# Patient Record
Sex: Female | Born: 1955 | Race: Black or African American | Hispanic: No | Smoking: Former smoker
Health system: Southern US, Community
[De-identification: ages and names within clinical notes are randomized; demographics above are authoritative.]

## PROBLEM LIST (undated history)

## (undated) DIAGNOSIS — E669 Obesity, unspecified: Secondary | ICD-10-CM

## (undated) DIAGNOSIS — M779 Enthesopathy, unspecified: Secondary | ICD-10-CM

## (undated) DIAGNOSIS — T7840XA Allergy, unspecified, initial encounter: Secondary | ICD-10-CM

## (undated) DIAGNOSIS — D219 Benign neoplasm of connective and other soft tissue, unspecified: Secondary | ICD-10-CM

## (undated) DIAGNOSIS — J189 Pneumonia, unspecified organism: Secondary | ICD-10-CM

## (undated) DIAGNOSIS — R51 Headache: Secondary | ICD-10-CM

## (undated) DIAGNOSIS — R519 Headache, unspecified: Secondary | ICD-10-CM

## (undated) DIAGNOSIS — D249 Benign neoplasm of unspecified breast: Secondary | ICD-10-CM

## (undated) DIAGNOSIS — M7730 Calcaneal spur, unspecified foot: Secondary | ICD-10-CM

## (undated) DIAGNOSIS — K635 Polyp of colon: Secondary | ICD-10-CM

## (undated) DIAGNOSIS — I10 Essential (primary) hypertension: Secondary | ICD-10-CM

## (undated) HISTORY — DX: Essential (primary) hypertension: I10

## (undated) HISTORY — DX: Enthesopathy, unspecified: M77.9

## (undated) HISTORY — DX: Polyp of colon: K63.5

## (undated) HISTORY — DX: Calcaneal spur, unspecified foot: M77.30

## (undated) HISTORY — DX: Benign neoplasm of unspecified breast: D24.9

## (undated) HISTORY — PX: VAGINAL HYSTERECTOMY: SUR661

## (undated) HISTORY — DX: Pneumonia, unspecified organism: J18.9

## (undated) HISTORY — DX: Obesity, unspecified: E66.9

## (undated) HISTORY — DX: Benign neoplasm of connective and other soft tissue, unspecified: D21.9

## (undated) HISTORY — DX: Allergy, unspecified, initial encounter: T78.40XA

---

## 1985-03-28 DIAGNOSIS — M779 Enthesopathy, unspecified: Secondary | ICD-10-CM

## 1985-03-28 HISTORY — DX: Enthesopathy, unspecified: M77.9

## 1988-03-28 DIAGNOSIS — J189 Pneumonia, unspecified organism: Secondary | ICD-10-CM

## 1988-03-28 HISTORY — DX: Pneumonia, unspecified organism: J18.9

## 2000-01-10 ENCOUNTER — Emergency Department (HOSPITAL_COMMUNITY): Admission: EM | Admit: 2000-01-10 | Discharge: 2000-01-10 | Payer: Self-pay | Admitting: Emergency Medicine

## 2000-06-26 ENCOUNTER — Other Ambulatory Visit: Admission: RE | Admit: 2000-06-26 | Discharge: 2000-06-26 | Payer: Self-pay | Admitting: Obstetrics and Gynecology

## 2001-06-29 ENCOUNTER — Other Ambulatory Visit: Admission: RE | Admit: 2001-06-29 | Discharge: 2001-06-29 | Payer: Self-pay | Admitting: Obstetrics and Gynecology

## 2002-09-03 ENCOUNTER — Other Ambulatory Visit: Admission: RE | Admit: 2002-09-03 | Discharge: 2002-09-03 | Payer: Self-pay | Admitting: Obstetrics and Gynecology

## 2002-09-10 ENCOUNTER — Encounter (INDEPENDENT_AMBULATORY_CARE_PROVIDER_SITE_OTHER): Payer: Self-pay | Admitting: *Deleted

## 2002-09-10 ENCOUNTER — Observation Stay (HOSPITAL_COMMUNITY): Admission: RE | Admit: 2002-09-10 | Discharge: 2002-09-11 | Payer: Self-pay | Admitting: Obstetrics and Gynecology

## 2003-11-04 ENCOUNTER — Other Ambulatory Visit: Admission: RE | Admit: 2003-11-04 | Discharge: 2003-11-04 | Payer: Self-pay | Admitting: Obstetrics and Gynecology

## 2004-05-03 ENCOUNTER — Ambulatory Visit: Payer: Self-pay | Admitting: Family Medicine

## 2004-06-14 ENCOUNTER — Encounter: Payer: Self-pay | Admitting: Family Medicine

## 2004-06-25 ENCOUNTER — Encounter: Admission: RE | Admit: 2004-06-25 | Discharge: 2004-06-25 | Payer: Self-pay | Admitting: Neurology

## 2004-07-14 ENCOUNTER — Ambulatory Visit: Payer: Self-pay | Admitting: Family Medicine

## 2004-08-10 ENCOUNTER — Ambulatory Visit: Payer: Self-pay | Admitting: Family Medicine

## 2005-04-01 ENCOUNTER — Ambulatory Visit: Payer: Self-pay | Admitting: Pulmonary Disease

## 2005-05-09 ENCOUNTER — Ambulatory Visit: Payer: Self-pay | Admitting: Pulmonary Disease

## 2005-07-04 ENCOUNTER — Other Ambulatory Visit: Admission: RE | Admit: 2005-07-04 | Discharge: 2005-07-04 | Payer: Self-pay | Admitting: Obstetrics and Gynecology

## 2005-07-20 ENCOUNTER — Ambulatory Visit: Payer: Self-pay | Admitting: Family Medicine

## 2005-07-27 ENCOUNTER — Ambulatory Visit: Payer: Self-pay | Admitting: Family Medicine

## 2006-09-21 ENCOUNTER — Encounter: Payer: Self-pay | Admitting: Family Medicine

## 2006-09-21 DIAGNOSIS — J45909 Unspecified asthma, uncomplicated: Secondary | ICD-10-CM | POA: Insufficient documentation

## 2006-09-22 ENCOUNTER — Ambulatory Visit: Payer: Self-pay | Admitting: Family Medicine

## 2006-09-22 LAB — CONVERTED CEMR LAB
ALT: 23 units/L (ref 0–35)
AST: 18 units/L (ref 0–37)
Albumin: 4 g/dL (ref 3.5–5.2)
Alkaline Phosphatase: 41 units/L (ref 39–117)
BUN: 8 mg/dL (ref 6–23)
Basophils Absolute: 0 10*3/uL (ref 0.0–0.1)
Basophils Relative: 0.9 % (ref 0.0–1.0)
Bilirubin, Direct: 0.1 mg/dL (ref 0.0–0.3)
CO2: 32 meq/L (ref 19–32)
Calcium: 9.2 mg/dL (ref 8.4–10.5)
Chloride: 109 meq/L (ref 96–112)
Cholesterol: 197 mg/dL (ref 0–200)
Creatinine, Ser: 0.7 mg/dL (ref 0.4–1.2)
Eosinophils Absolute: 0 10*3/uL (ref 0.0–0.6)
Eosinophils Relative: 0.6 % (ref 0.0–5.0)
GFR calc Af Amer: 114 mL/min
GFR calc non Af Amer: 95 mL/min
Glucose, Bld: 88 mg/dL (ref 70–99)
HCT: 37.9 % (ref 36.0–46.0)
HDL: 34.8 mg/dL — ABNORMAL LOW (ref >39.0)
Hemoglobin: 12.7 g/dL (ref 12.0–15.0)
LDL Cholesterol: 150 mg/dL — ABNORMAL HIGH (ref 0–99)
Lymphocytes Relative: 43 % (ref 12.0–46.0)
MCHC: 33.4 g/dL (ref 30.0–36.0)
MCV: 87.3 fL (ref 78.0–100.0)
Monocytes Absolute: 0.4 10*3/uL (ref 0.2–0.7)
Monocytes Relative: 10.2 % (ref 3.0–11.0)
Neutro Abs: 1.9 10*3/uL (ref 1.4–7.7)
Neutrophils Relative %: 45.3 % (ref 43.0–77.0)
Platelets: 277 10*3/uL (ref 150–400)
Potassium: 3.6 meq/L (ref 3.5–5.1)
RBC: 4.34 M/uL (ref 3.87–5.11)
RDW: 11.7 % (ref 11.5–14.6)
Sodium: 145 meq/L (ref 135–145)
TSH: 0.17 microintl units/mL — ABNORMAL LOW (ref 0.35–5.50)
Total Bilirubin: 0.7 mg/dL (ref 0.3–1.2)
Total CHOL/HDL Ratio: 5.7
Total Protein: 7.6 g/dL (ref 6.0–8.3)
Triglycerides: 63 mg/dL (ref 0–149)
VLDL: 13 mg/dL (ref 0–40)
WBC: 4.1 10*3/uL — ABNORMAL LOW (ref 4.5–10.5)

## 2006-11-09 ENCOUNTER — Ambulatory Visit: Payer: Self-pay | Admitting: Family Medicine

## 2006-11-09 DIAGNOSIS — F17210 Nicotine dependence, cigarettes, uncomplicated: Secondary | ICD-10-CM | POA: Insufficient documentation

## 2006-11-09 DIAGNOSIS — I119 Hypertensive heart disease without heart failure: Secondary | ICD-10-CM | POA: Insufficient documentation

## 2006-12-14 ENCOUNTER — Ambulatory Visit: Payer: Self-pay | Admitting: Family Medicine

## 2006-12-14 DIAGNOSIS — L2089 Other atopic dermatitis: Secondary | ICD-10-CM | POA: Insufficient documentation

## 2006-12-14 DIAGNOSIS — H6120 Impacted cerumen, unspecified ear: Secondary | ICD-10-CM | POA: Insufficient documentation

## 2007-09-03 ENCOUNTER — Telehealth: Payer: Self-pay | Admitting: Family Medicine

## 2008-06-30 ENCOUNTER — Ambulatory Visit: Payer: Self-pay | Admitting: Family Medicine

## 2008-06-30 LAB — CONVERTED CEMR LAB
ALT: 20 units/L (ref 0–35)
AST: 24 units/L (ref 0–37)
Albumin: 4 g/dL (ref 3.5–5.2)
Alkaline Phosphatase: 39 units/L (ref 39–117)
BUN: 13 mg/dL (ref 6–23)
Basophils Absolute: 0 10*3/uL (ref 0.0–0.1)
Basophils Relative: 0.7 % (ref 0.0–3.0)
Bilirubin Urine: NEGATIVE
Bilirubin, Direct: 0.1 mg/dL (ref 0.0–0.3)
Blood in Urine, dipstick: NEGATIVE
CO2: 29 meq/L (ref 19–32)
Calcium: 9.3 mg/dL (ref 8.4–10.5)
Chloride: 101 meq/L (ref 96–112)
Cholesterol: 184 mg/dL (ref 0–200)
Creatinine, Ser: 0.8 mg/dL (ref 0.4–1.2)
Direct LDL: 113.1 mg/dL
Eosinophils Absolute: 0.1 10*3/uL (ref 0.0–0.7)
Eosinophils Relative: 2.3 % (ref 0.0–5.0)
GFR calc non Af Amer: 97.14 mL/min (ref >60)
Glucose, Bld: 106 mg/dL — ABNORMAL HIGH (ref 70–99)
Glucose, Urine, Semiquant: NEGATIVE
HCT: 37.3 % (ref 36.0–46.0)
HDL: 30.9 mg/dL — ABNORMAL LOW (ref >39.00)
Hemoglobin: 12.6 g/dL (ref 12.0–15.0)
Ketones, urine, test strip: NEGATIVE
Lymphocytes Relative: 54.3 % — ABNORMAL HIGH (ref 12.0–46.0)
Lymphs Abs: 2.5 10*3/uL (ref 0.7–4.0)
MCHC: 33.9 g/dL (ref 30.0–36.0)
MCV: 87.5 fL (ref 78.0–100.0)
Monocytes Absolute: 0.5 10*3/uL (ref 0.1–1.0)
Monocytes Relative: 9.6 % (ref 3.0–12.0)
Neutro Abs: 1.6 10*3/uL (ref 1.4–7.7)
Neutrophils Relative %: 33.1 % — ABNORMAL LOW (ref 43.0–77.0)
Nitrite: NEGATIVE
Platelets: 218 10*3/uL (ref 150.0–400.0)
Potassium: 3.2 meq/L — ABNORMAL LOW (ref 3.5–5.1)
RBC: 4.26 M/uL (ref 3.87–5.11)
RDW: 11.9 % (ref 11.5–14.6)
Sodium: 140 meq/L (ref 135–145)
Specific Gravity, Urine: >=1.03
TSH: 0.28 microintl units/mL — ABNORMAL LOW (ref 0.35–5.50)
Total Bilirubin: 0.5 mg/dL (ref 0.3–1.2)
Total CHOL/HDL Ratio: 6
Total Protein: 7.5 g/dL (ref 6.0–8.3)
Triglycerides: 207 mg/dL — ABNORMAL HIGH (ref 0.0–149.0)
Urobilinogen, UA: 0.2
VLDL: 41.4 mg/dL — ABNORMAL HIGH (ref 0.0–40.0)
WBC Urine, dipstick: NEGATIVE
WBC: 4.7 10*3/uL (ref 4.5–10.5)
pH: 6

## 2008-07-02 ENCOUNTER — Encounter: Payer: Self-pay | Admitting: Family Medicine

## 2008-11-11 ENCOUNTER — Telehealth: Payer: Self-pay | Admitting: Adult Health

## 2008-11-11 ENCOUNTER — Ambulatory Visit: Payer: Self-pay | Admitting: Adult Health

## 2008-11-11 DIAGNOSIS — G4730 Sleep apnea, unspecified: Secondary | ICD-10-CM | POA: Insufficient documentation

## 2008-11-11 DIAGNOSIS — T7840XA Allergy, unspecified, initial encounter: Secondary | ICD-10-CM | POA: Insufficient documentation

## 2009-03-06 ENCOUNTER — Ambulatory Visit: Payer: Self-pay | Admitting: Family Medicine

## 2009-05-13 ENCOUNTER — Telehealth: Payer: Self-pay | Admitting: Pulmonary Disease

## 2009-05-29 ENCOUNTER — Emergency Department (HOSPITAL_COMMUNITY): Admission: EM | Admit: 2009-05-29 | Discharge: 2009-05-29 | Payer: Self-pay | Admitting: Emergency Medicine

## 2009-10-30 ENCOUNTER — Ambulatory Visit: Payer: Self-pay | Admitting: Family Medicine

## 2010-03-24 ENCOUNTER — Ambulatory Visit: Payer: Self-pay | Admitting: Family Medicine

## 2010-03-24 LAB — CONVERTED CEMR LAB
Albumin: 4.1 g/dL (ref 3.5–5.2)
BUN: 20 mg/dL (ref 6–23)
Basophils Absolute: 0 10*3/uL (ref 0.0–0.1)
Bilirubin Urine: NEGATIVE
CO2: 33 meq/L — ABNORMAL HIGH (ref 19–32)
Direct LDL: 164.1 mg/dL
Eosinophils Absolute: 0.1 10*3/uL (ref 0.0–0.7)
GFR calc non Af Amer: 91.21 mL/min (ref >60.00)
Glucose, Bld: 86 mg/dL (ref 70–99)
Glucose, Urine, Semiquant: NEGATIVE
HCT: 38.5 % (ref 36.0–46.0)
HDL: 45.9 mg/dL (ref >39.00)
Hemoglobin: 12.8 g/dL (ref 12.0–15.0)
Ketones, urine, test strip: NEGATIVE
Lymphs Abs: 3.3 10*3/uL (ref 0.7–4.0)
MCHC: 33.2 g/dL (ref 30.0–36.0)
MCV: 89.1 fL (ref 78.0–100.0)
Monocytes Absolute: 0.5 10*3/uL (ref 0.1–1.0)
Monocytes Relative: 8.1 % (ref 3.0–12.0)
Neutro Abs: 2.5 10*3/uL (ref 1.4–7.7)
Nitrite: NEGATIVE
Platelets: 281 10*3/uL (ref 150.0–400.0)
Potassium: 4.1 meq/L (ref 3.5–5.1)
Protein, U semiquant: NEGATIVE
RDW: 13.1 % (ref 11.5–14.6)
Specific Gravity, Urine: 1.025
TSH: 0.44 microintl units/mL (ref 0.35–5.50)
Total Bilirubin: 0.7 mg/dL (ref 0.3–1.2)
Urobilinogen, UA: 0.2
WBC Urine, dipstick: NEGATIVE
pH: 5.5

## 2010-04-17 ENCOUNTER — Encounter: Payer: Self-pay | Admitting: Neurology

## 2010-04-19 ENCOUNTER — Ambulatory Visit
Admission: RE | Admit: 2010-04-19 | Discharge: 2010-04-19 | Payer: Self-pay | Source: Home / Self Care | Attending: Family Medicine | Admitting: Family Medicine

## 2010-04-19 ENCOUNTER — Encounter: Payer: Self-pay | Admitting: Family Medicine

## 2010-04-19 DIAGNOSIS — R319 Hematuria, unspecified: Secondary | ICD-10-CM | POA: Insufficient documentation

## 2010-04-27 NOTE — Progress Notes (Signed)
Summary: old pt  Phone Note Call from Patient Call back at Work Phone [phone] Call back at page her   Caller: Patient Call For: nadel Reason for Call: Talk to Nurse Summary of Call: pt hasn't seen SN sin 04/2005.  Saw TP in 10/2008.  Wanted to schedule appt w/ SN.  Will he still see this pt? Initial call taken by: Eugene Gavia,  May 13, 2009 10:55 AM  Follow-up for Phone Call        per SN---she will need to follow up with her primary care doctor first and they will refer her to someone in [facility] practice if they feel she needs to see pulmonary doc.  SN schedule is booked out until may.  thanks Randell Loop [facility]  May 14, 2009 2:35 PM   lmtcb  Philipp Deputy [facility]  May 14, 2009 2:40 PM   pt advised. she states she will get her medical records and go to another MD. Carron Curie CMA  May 14, 2009 3:51 PM

## 2010-04-27 NOTE — Assessment & Plan Note (Signed)
Summary: bp elevated ok per rachel/njr   Vital Signs:  Patient profile:   55 year old female Height:      66.75 inches Weight:      181 pounds BMI:     28.66 Temp:     98.4 degrees F oral BP sitting:   162 / 108  (left arm) Cuff size:   regular  Vitals Entered By: Kern Reap CMA Duncan Dull) (October 30, 2009 9:50 AM) CC: elevated blood pressure Is Patient Diabetic? No   Primary Care Provider:  Tawanna Cooler   CC:  elevated blood pressure.  History of Present Illness: Alison [name] is a 55 year old female, who works at the tobacco company........ who also smokes 10 cigarettes a day.......Marland Kitchen we have outlined a smoking cessation program, which she has not dissipated in as yet...Marland KitchenMarland KitchenMarland Kitchen who comes in today for evaluation of hypertension.  Six weeks ago.  She stopped her blood pressure medication because the pharmacy did not have the Dyazide.  She was off the medication for one month and restarted it 8 days ago.  He P. 160/110, asymptomatic  Allergies: 1)  Penicillin V Potassium (Penicillin V Potassium)  Social History: Reviewed history from 11/11/2008 and no changes required. 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 Take 1 tablet by mouth every morning  Orders: Tobacco use cessation intermediate 3-10 minutes (99406)  Complete Medication List: 1)  Aspirin 81 Mg Tbec (Aspirin) .... Take one tablet by mouth every other day alternating with hctz 2)  Ambien 5 Mg Tabs (Zolpidem tartrate) .... Take 1  tablet by mouth at bedtime 3)  Proair Hfa 108 (90 Base) Mcg/act Aers (Albuterol sulfate) .... Inhale 2 puffs every four hours as needed 4)  Dyazide 37.5-25 Mg Caps (Triamterene-hctz) .... Take 1 tablet by mouth every morning  Patient Instructions: 1)  take the Dyazide one daily, and monitor your blood pressure every morning.  Return in 3 weeks with the data and the device. 2)  Take the baby aspirin ........ one tablet Monday, Wednesday, Friday. 3)  Consider the smoking cessation program that was outlined.  We will discuss that when you come back in 3 weeks

## 2010-04-29 NOTE — Assessment & Plan Note (Signed)
Summary: CPX // RS   Vital Signs:  Patient profile:   55 year old female Height:      66.75 inches Weight:      180 pounds Temp:     99.4 degrees F oral BP sitting:   110 / 80  (left arm) Cuff size:   regular  Vitals Entered By: Kern Reap CMA Duncan Dull) (April 19, 2010 4:04 PM) CC: cpx Is Patient Diabetic? No   Primary Care Provider:  Tawanna Cooler   CC:  cpx.  History of Present Illness: Alison [name] is a delightful, 55 year old female, who comes in today for general medical examination.  Because of a history of hypertension.  She takes Dyazide 37.5 -- 25 daily BP 110/80.  She takes Ambien p.r.n. for sleep dysfunction and albuterol  p.r.n. for asthma, which typically flares up, if she gets a bad cold.  She's not had to use the albuterol  at all.  This year  About 5 years ago.  She had her uterus removed for fibroids.  Ovaries were left intact.  She gets routine eye care, dental care, the subcutaneous every now and then, mammogram.  Last 4  years ago 2007 advised to call and get a mammogram set up ASAP.  Colonoscopy normal.  Tetanus 2003.  She is also trying to stop smoking.  She stand to 3 cigarettes a day  Labs were reviewed.  Her lipids are slightly elevated with a LDL of 160+ also preliminary UA showed 1+ blood, and asymptomatic.  Repeat urinalysis shows 1+ hematuria asymptomatic.  Will treat with Septra for 10 days and follow-up.  He laid to be, sure it's clear   Allergies: 1)  Penicillin V Potassium (Penicillin V Potassium)  Past History:  Past medical, surgical, family and social histories (including risk factors) reviewed, and no changes noted (except as noted below).  Past Medical History: Reviewed history from 09/21/2006 and no changes required. TA Asthma OBESE MHA BENIGN FIBROEDNOMA-RT BREAST  Past Surgical History: Reviewed history from 09/21/2006 and no changes required. BONE SPUR L KNEE-87' CBX1 PNEUMONIA-90 HEEL SPUR R FOOT  TAH-FIBROIDS  Family  History: Reviewed history from 11/11/2008 and no changes required. heart disease - father  Social History: Reviewed history from 11/11/2008 and no changes required. current smoker - x35yrs, 1ppd no alcohol married 1 child drinks 1 cup caffeine daily works in Set designer as a Holiday representative  Review of Systems      See HPI  Physical Exam  General:  Well-developed,well-nourished,in no acute distress; alert,appropriate and cooperative throughout examination Head:  Normocephalic and atraumatic without obvious abnormalities. No apparent alopecia or balding. Eyes:  No corneal or conjunctival inflammation noted. EOMI. Perrla. Funduscopic exam benign, without hemorrhages, exudates or papilledema. Vision grossly normal. Ears:  External ear exam shows no significant lesions or deformities.  Otoscopic examination reveals clear canals, tympanic membranes are intact bilaterally without bulging, retraction, inflammation or discharge. Hearing is grossly normal bilaterally. Nose:  External nasal examination shows no deformity or inflammation. Nasal mucosa are pink and moist without lesions or exudates. Mouth:  Oral mucosa and oropharynx without lesions or exudates.  Teeth in good repair. Neck:  No deformities, masses, or tenderness noted. Chest Wall:  No deformities, masses, or tenderness noted. Breasts:  No mass, nodules, thickening, tenderness, bulging, retraction, inflamation, nipple discharge or skin changes noted.   Lungs:  Normal respiratory effort, chest expands symmetrically. Lungs are clear to auscultation, no crackles or wheezes. Heart:  Normal rate and regular rhythm. S1 and S2 normal  without gallop, murmur, click, rub or other extra sounds. Abdomen:  Bowel sounds positive,abdomen soft and non-tender without masses, organomegaly or hernias noted. Rectal:  No external abnormalities noted. Normal sphincter tone. No rectal masses or tenderness. Genitalia:  Pelvic Exam:         External: normal female genitalia without lesions or masses        Vagina: normal without lesions or masses        Cervix: normal without lesions or masses        Adnexa: normal bimanual exam without masses or fullness        Uterus: normal by palpation        Pap smear: not performed Msk:  No deformity or scoliosis noted of thoracic or lumbar spine.   Pulses:  R and L carotid,radial,femoral,dorsalis pedis and posterior tibial pulses are full and equal bilaterally Extremities:  No clubbing, cyanosis, edema, or deformity noted with normal full range of motion of all joints.   Neurologic:  No cranial nerve deficits noted. Station and gait are normal. Plantar reflexes are down-going bilaterally. DTRs are symmetrical throughout. Sensory, motor and coordinative functions appear intact. Skin:  Intact without suspicious lesions or rashes Cervical Nodes:  No lymphadenopathy noted Axillary Nodes:  No palpable lymphadenopathy Inguinal Nodes:  No significant adenopathy Psych:  Cognition and judgment appear intact. Alert and cooperative with normal attention span and concentration. No apparent delusions, illusions, hallucinations   Problems:  Medical Problems Added: 1)  Dx of Hematuria  (ICD-599.70)  Impression & Recommendations:  Problem # 1:  DISEASE, HYPERTENSIVE HEART NOS, W/O HF (ICD-402.90) Assessment Improved  Her updated medication list for this problem includes:    Dyazide 37.5-25 Mg Caps (Triamterene-hctz) .Marland Kitchen... Take 1 tablet by mouth every morning  Orders: Prescription Created Electronically [phone]) Tobacco use cessation intermediate 3-10 minutes (99406) EKG w/ Interpretation (93000)  Problem # 2:  HEALTH SCREENING (ICD-V70.0) Assessment: Unchanged  Orders: Prescription Created Electronically [phone]) Tobacco use cessation intermediate 3-10 minutes (99406) EKG w/ Interpretation (93000)  Problem # 3:  DISORDER, TOBACCO USE (ICD-305.1) Assessment: Improved  Her updated  medication list for this problem includes:    Chantix Continuing Month Pak 1 Mg Tabs (Varenicline tartrate) ..... Uad  Orders: Prescription Created Electronically [phone]) Tobacco use cessation intermediate 3-10 minutes (99406) EKG w/ Interpretation (93000)  Problem # 4:  HEMATURIA (ICD-599.70) Assessment: New  Her updated medication list for this problem includes:    Septra Ds 800-160 Mg Tabs (Sulfamethoxazole-trimethoprim) .Marland Kitchen... Take 1 tablet by mouth two times a day  Orders: UA Dipstick w/o Micro (manual) (01027)  Complete Medication List: 1)  Aspirin 81 Mg Tbec (Aspirin) .... Take one tablet by mouth every other day alternating with hctz 2)  Ambien 5 Mg Tabs (Zolpidem tartrate) .... Take 1 tablet by mouth at bedtime 3)  Proair Hfa 108 (90 Base) Mcg/act Aers (Albuterol sulfate) .... Inhale 2 puffs every four hours as needed 4)  Dyazide 37.5-25 Mg Caps (Triamterene-hctz) .... Take 1 tablet by mouth every morning 5)  Chantix Continuing Month Pak 1 Mg Tabs (Varenicline tartrate) .... Uad 6)  Septra Ds 800-160 Mg Tabs (Sulfamethoxazole-trimethoprim) .... Take 1 tablet by mouth two times a day  Patient Instructions: 1)  continue to taper by one cigarette per week and had the chantix one half tablet every morning for 4 months 2)  Schedule your mammogram. 3)  Take Septra at one twice daily for two weeks.  Return in 3 weeks for follow-up Prescriptions: SEPTRA DS 800-160  MG TABS (SULFAMETHOXAZOLE-TRIMETHOPRIM) Take 1 tablet by mouth two times a day  #28 x 0   Entered and Authorized by:   Roderick Pee MD   Signed by:   Roderick Pee MD on 04/19/2010   Method used:   Electronically to        [facility] (retail)       [address]       [place], [place]  [id number]       Ph: [phone]       Fax: [phone]   RxID:   [id number] SEPTRA DS 800-160 MG TABS (SULFAMETHOXAZOLE-TRIMETHOPRIM) Take 1 tablet by mouth two times a day  #28 x 0   Entered and  Authorized by:   Roderick Pee MD   Signed by:   Roderick Pee MD on 04/19/2010   Method used:   Printed then faxed to ...       [facility] (retail)       [address]       [place], [place]  [id number]       Ph: [phone]       Fax: [phone]   RxID:   [id number] AMBIEN 5 MG TABS (ZOLPIDEM TARTRATE) Take 1 tablet by mouth at bedtime  #30 Tablet x 5   Entered and Authorized by:   Roderick Pee MD   Signed by:   Roderick Pee MD on 04/19/2010   Method used:   Printed then faxed to ...       [facility] (retail)       [address]       [place], [place]  [id number]       Ph: [phone]       Fax: [phone]   RxID:   [id number] DYAZIDE 37.5-25 MG CAPS (TRIAMTERENE-HCTZ) Take 1 tablet by mouth every morning  #100 Tablet x 3   Entered and Authorized by:   Roderick Pee MD   Signed by:   Roderick Pee MD on 04/19/2010   Method used:   Electronically to        [facility] (retail)       [address]       [place], [place]  [address]       Ph: [phone]       Fax: [phone]   RxID:   [id number] CHANTIX CONTINUING MONTH PAK 1 MG TABS (VARENICLINE TARTRATE) UAD  #1 x 2   Entered and Authorized by:   Roderick Pee MD   Signed by:   Roderick Pee MD on 04/19/2010   Method used:   Electronically to        [facility] (retail)       [address]       [place], [place]  [id number]       Ph: [phone]       Fax: [phone]   RxID:   [id number]    Orders Added: 1)  Prescription Created Electronically [phone] 2)  Tobacco use cessation intermediate 3-10 minutes [99406] 3)  Est. Patient 40-64 years [99396] 4)  EKG w/ Interpretation [93000] 5)  UA Dipstick w/o Micro (manual) [81002]     Prevention & Chronic Care Immunizations   Influenza vaccine: Not documented    Tetanus booster: 03/28/2001: Td    Pneumococcal vaccine: Not  documented  Colorectal Screening  Hemoccult: Not documented    Colonoscopy: Not documented  Other Screening   Pap smear: Not documented    Mammogram: Done  (10/11/2005)   Smoking status: current  (11/11/2008)   Smoking cessation counseling: yes  (11/09/2006)  Lipids   Total Cholesterol: 227  (03/24/2010)   LDL: 150  (09/22/2006)   LDL Direct: 164.1  (03/24/2010)   HDL: 45.90  (03/24/2010)   Triglycerides: 144.0  (03/24/2010)

## 2010-05-17 ENCOUNTER — Encounter: Payer: Self-pay | Admitting: Family Medicine

## 2010-05-17 ENCOUNTER — Ambulatory Visit (INDEPENDENT_AMBULATORY_CARE_PROVIDER_SITE_OTHER): Payer: 59 | Admitting: Family Medicine

## 2010-05-17 DIAGNOSIS — F17200 Nicotine dependence, unspecified, uncomplicated: Secondary | ICD-10-CM

## 2010-05-17 DIAGNOSIS — R319 Hematuria, unspecified: Secondary | ICD-10-CM

## 2010-05-17 LAB — POCT URINALYSIS DIPSTICK
Ketones, UA: NEGATIVE
Nitrite, UA: NEGATIVE
Protein, UA: NEGATIVE
Urobilinogen, UA: NEGATIVE
pH, UA: 6

## 2010-05-17 NOTE — Progress Notes (Signed)
  Subjective:    Patient ID: Alison [name], female    DOB: 10/23/1955, 55 y.o.   MRN: [id number]  HPIoneida Is a delightful, 55 year old, married female, who comes in today for follow-up of two problems.  We saw her for a physical examination she had asymptomatic hematuria.  We put on Septra for two weeks and she is here for follow up to be sure the hematuria has resolved.  She states she is urinating normally.  No pain  Urinalysis normal.  Today.  She also is a smoker and has cut down to 3 cigarettes a day, but can't seem to quit.  We discussed very as strategies including the chantix program.  I encouraged her to do the chantix program and quit smoking completely.    Review of Systems    neg Objective:   Physical Exam    Well-developed well-nourished, female, in no acute distress    Assessment & Plan:  Hematuria,,,,,,,,,, normal urine.  Tobacco abuse,,,,,,,,, outlined smoking cessation program with chantix  Return one year or sooner if any problems

## 2010-05-17 NOTE — Patient Instructions (Signed)
Start the chantix program one half tablet daily, x 4 months and stop smoking completely.  Return in one year or sooner if any problems

## 2010-06-21 LAB — BASIC METABOLIC PANEL
CO2: 26 mEq/L (ref 19–32)
Calcium: 8.3 mg/dL — ABNORMAL LOW (ref 8.4–10.5)
Chloride: 99 mEq/L (ref 96–112)
GFR calc Af Amer: >60 mL/min (ref >60)
Glucose, Bld: 123 mg/dL — ABNORMAL HIGH (ref 70–99)
Sodium: 131 mEq/L — ABNORMAL LOW (ref 135–145)

## 2010-06-21 LAB — URINALYSIS, ROUTINE W REFLEX MICROSCOPIC
Bilirubin Urine: NEGATIVE
Ketones, ur: NEGATIVE mg/dL
Leukocytes, UA: NEGATIVE
Nitrite: NEGATIVE
Specific Gravity, Urine: 1.025 (ref 1.005–1.030)
Urobilinogen, UA: 0.2 mg/dL (ref 0.0–1.0)
pH: 6.5 (ref 5.0–8.0)

## 2010-06-21 LAB — CBC
HCT: 42.5 % (ref 36.0–46.0)
Hemoglobin: 14.1 g/dL (ref 12.0–15.0)
MCHC: 33.1 g/dL (ref 30.0–36.0)
MCV: 89.4 fL (ref 78.0–100.0)
RBC: 4.76 MIL/uL (ref 3.87–5.11)
RDW: 13.3 % (ref 11.5–15.5)

## 2010-06-21 LAB — DIFFERENTIAL
Basophils Absolute: 0 10*3/uL (ref 0.0–0.1)
Basophils Relative: 0 % (ref 0–1)
Eosinophils Absolute: 0.1 10*3/uL (ref 0.0–0.7)
Eosinophils Relative: 1 % (ref 0–5)
Monocytes Absolute: 1.1 10*3/uL — ABNORMAL HIGH (ref 0.1–1.0)
Monocytes Relative: 10 % (ref 3–12)

## 2010-06-21 LAB — URINE MICROSCOPIC-ADD ON

## 2010-08-13 NOTE — Discharge Summary (Signed)
Alison [name], Alison [name] NO.:  [id number]   MEDICAL RECORD NO.:  [id number]                   PATIENT TYPE:  OBV   LOCATION:  9307                                 [facility]ILITY:  [facility]   PHYSICIAN:  Janine Limbo, M.D.            DATE OF BIRTH:  [birth year]   DATE OF ADMISSION:  09/10/2002  DATE OF DISCHARGE:  09/11/2002                                 DISCHARGE SUMMARY   DISCHARGE DIAGNOSES:  1. Fibroid uterus (14 to 16 weeks size).  2. Menorrhagia.  3. Dysmenorrhea.   OPERATION:  On the date of admission the patient underwent a vaginal  hysterectomy with fibroid morcellation, tolerating the procedure well.  The  patient was found to have an 8-weeks size uterus with a 14 cm pedunculated  fibroid.  Her tubes and ovaries appeared normal bilaterally.   HISTORY OF PRESENT ILLNESS:  Alison [name] is a 55 year old female para 1-0-  0-1 who presented for vaginal hysterectomy because of symptomatic uterine  fibroid - menorrhagia, dysmenorrhea, and anemia.  Please see the patient's  dictated history and physical examination for details.   PHYSICAL EXAMINATION:  VITAL SIGNS:  Weight 186 pounds.  GENERAL:  Within normal limits.  PELVIC:  External genitalia is normal, vagina is normal except for  relaxation of the vaginal supportive tissue, cervix is nontender with no  lesions being appreciated.  Uterus is 12 weeks size, irregular, and firm.  Adnexa no masses are appreciated.  Rectovaginal examination confirms.   HOSPITAL COURSE:  On the date of admission the patient underwent  aforementioned procedures tolerating them all well.  Postoperative course  was unremarkable with her postop hemoglobin being 10.9 (preop hemoglobin  12.3).  By postop day #1 the patient had resumed bowel and bladder function  and was deemed ready for discharge home.   DISCHARGE MEDICATIONS:  1. Tylox one to two tablets every 4 hours as needed for pain.  2. Iron 325 mg one tablet  twice daily for 6 weeks.  3. Phenergan one tablet every 6 hours as needed for nausea.   FOLLOW UP:  The patient is to call [facility] for a 6 weeks  postoperative visit with Dr. Stefano Gaul.   POSTOPERATIVE INSTRUCTIONS:  The patient was given a copy of [facility] postoperative instruction sheet.  She was further advised to  call for temperatures greater than 101 or for any severe problems.  She was  also advised to avoid driving for 2 weeks, heaving lifting for 4 weeks and  intercourse for 6 weeks.  The patient's diet was without restriction.    FINAL PATHOLOGY:  Uterus excision: Benign secretory endometrium without  atypia or evidence of malignancy, leiomyomas, serosal fibrovascular  adhesions and findings consistent with endometriosis, benign cervix with  Nabothian cyst, no dysplasia identified.  Portion of fallopian tube:  Complete transection of fallopian tube with no pathologic abnormality.     Elmira J. Powell, P.A.  Janine Limbo, M.D.    EJP/MEDQ  D:  10/03/2002  T:  10/04/2002  Job:  [id number]

## 2010-08-13 NOTE — Op Note (Signed)
NAMEVIKI, CARRERA NO.:  [id number]   MEDICAL RECORD NO.:  [id number]                   PATIENT TYPE:  OBV   LOCATION:  9307                                 FACILITY:  [facility]   PHYSICIAN:  Janine Limbo, M.D.            DATE OF BIRTH:  [birth year]   DATE OF PROCEDURE:  09/10/2002  DATE OF DISCHARGE:                                 OPERATIVE REPORT   PREOPERATIVE DIAGNOSES:  1. Twelve-week size fibroid uterus.  2. Menorrhagia.  3. Dysmenorrhea.   POSTOPERATIVE DIAGNOSES:  1. Fourteen-week size fibroid uterus.  2. Dysmenorrhea.  3. Menorrhagia.   PROCEDURE:  Vaginal hysterectomy with fibroid morcellation.   SURGEON:  Janine Limbo, M.D.   FIRST ASSISTANT:  Osborn Coho, M.D.   ANESTHESIA:  General.   DISPOSITION:  The patient is a 55 year old female, para 1-0-0-1, who  presents with the above-mentioned diagnoses.  She understands the  indications for her procedure, and she accepts the risks of, but not limited  to, anesthetic complications, bleeding, infection, and possible damage to  the surrounding organs.   FINDINGS:  The patient was found to have an eight-week size uterus, but  there was a 14 cm pedunculated fibroid present on the right posterior  fundus.  The estimated weight for the uterus was 490 g.  The fallopian tubes  and the ovaries appeared normal.   PROCEDURE:  The patient was taken to the operating room, where a general  anesthetic was given.  The patient's abdomen, perineum, and vagina were  prepped with multiple layers of Betadine.  A Foley catheter was placed in  the bladder.  Examination under anesthesia was performed.  The patient was  sterilely draped.  The cervix was injected with a diluted solution of  Pitressin and saline.  A circumferential incision was made around the  cervix.  The posterior cul-de-sac and then the anterior cul-de-sac were  sharply entered.  The posterior cuff was sutured using a  running locking  suture of 0 Vicryl.  Alternating from right to left, the uterosacral  ligaments, paracervical tissues, parametrial tissues, and uterine arteries  were clamped, cut, sutured, and tied securely.  We attempted to invert the  uterus through the posterior colpotomy, but this was unsuccessful.  The  large posterior fibroid was noted.  Attempts were made to remove the  posterior fibroid through the posterior colpotomy.  These were unsuccessful.  We then began to remove portions of the fibroid in hopes of removing it  through the posterior colpotomy.  Again these attempts were unsuccessful.  We were able to then secure the upper pedicles of the uterus using clamps.  The uterus was cut and removed from the operative field.  The pedunculated  myoma was clamped at its base and the uterus was transected from the  pedunculated myoma.  The upper pedicle on the left was free-tied and then  suture ligated.  Hemostasis was adequate.  We tried to remove the  pedunculated myoma through the vaginal incision, but again we were  unsuccessful because of its large size.  We then had to progressively remove  portions of the fibroid until we were able to finally remove the specimen  from the vagina.  The fibroid measured approximately 16 cm in diameter when  all was said and done.  We were then able to clamp the upper pedicle on the  right.  The area was free-tied and then suture ligated.  Hemostasis was  achieved on the right using figure-of-eight sutures.  We were then noted to  have brisk bleeding from the posterior cuff, and figure-of-eight sutures  were placed on the posterior cuff.  At this point hemostasis was felt to be  adequate in the pelvic cavity.  The sutures attached to the uterosacral  ligaments were brought out through the vaginal angles and tied securely.  A  McCall culdoplasty suture was placed in the posterior cul-de-sac,  incorporating the uterosacral ligaments bilaterally and  the posterior  peritoneum.  The vaginal cuff was then closed using figure-of-eight sutures  incorporating the anterior vaginal mucosa, the anterior peritoneum, the  posterior peritoneum, and the posterior vaginal mucosa.  The McCall  culdoplasty suture was tied securely and the apex of the vagina was noted to  elevate into the mid-pelvis.  All instruments were removed.  The patient was  noted to have two lacerations on the vagina from the retractors.  These  lacerations were repaired using figure-of-eight sutures.  Sponge, needle,  and instrument counts were correct.  The estimated blood loss was 475 mL.  The patient's vital signs remained stable.  The vagina was packed using  iodoform gauze.  She was noted to drain clear yellow urine.  The patient was  awakened from her anesthetic and taken to the recovery room in stable  condition.                                                Janine Limbo, M.D.    AVS/MEDQ  D:  09/10/2002  T:  09/11/2002  Job:  [id number]

## 2010-08-13 NOTE — H&P (Signed)
NAMEMARIONA, Alison [name] NO.:  [id number]   MEDICAL RECORD NO.:  [id number]                   PATIENT TYPE:  AMB   LOCATION:  SDC                                  FACILITY:  [facility]   PHYSICIAN:  Janine Limbo, M.D.            DATE OF BIRTH:  10-06-1955   DATE OF ADMISSION:  09/10/2002  DATE OF DISCHARGE:                                HISTORY & PHYSICAL   HISTORY OF PRESENT ILLNESS:  The patient is a 55 year old female para 1-0-0-  1 who presents for a vaginal hysterectomy.  She has been followed at the  [facility] and Gynecology division.  She has a known  history of fibroids.  An ultrasound showed an enlarged uterus with fibroids  measuring 5 x 4 cm and 3 x 3 cm.  An endometrial biopsy was within normal  limits.  Her most recent Pap smear was within normal limits.  The patient  also complains of menorrhagia, dysmenorrhea, and anemia.  Pain medications  have not relieved her discomfort.  Hormonal therapy has been ineffective.  She has had one cesarean section at term.   PAST MEDICAL HISTORY:  The patient has a history of sarcoidosis, but is  currently doing quite well.  She denies hypertension and diabetes.   CURRENT MEDICATIONS:  1. Anaprox.  2. Iron.   ALLERGIES:  The patient was told at a young age that she was allergic to  PENICILLIN but she does not know what this reaction was.   SOCIAL HISTORY:  The patient denies cigarette use, alcohol use, and  recreational drug use.   REVIEW OF SYSTEMS:  Please see history of present illness.   FAMILY HISTORY:  The patient's mother has a history of hypertension.  She  died of an unknown tumor.  The patient's father has a history of heart  disease.   PHYSICAL EXAMINATION:  VITAL SIGNS:  Weight 186 pounds.  HEENT:  Within normal limits.  CHEST:  Clear.  HEART:  Regular rate and rhythm.  BREASTS:  Without masses.  ABDOMEN:  Nontender.  No masses are appreciated.  EXTREMITIES:   Without clubbing, cyanosis, edema.  NEUROLOGIC:  Grossly normal.  PELVIC:  External genitalia is normal.  The vagina is normal except for  relaxation of the vaginal supportive tissue.  Cervix is nontender and no  lesions are appreciated.  The uterus is 12 weeks size, irregular, and firm.  Adnexa:  No masses are appreciated.  Rectovaginal examination confirms.   ASSESSMENT:  1. A 12-week sized fibroid uterus.  2. Menorrhagia.  3. Dysmenorrhea.  4. Anemia.   PLAN:  The patient will undergo a vaginal hysterectomy.  She has declined  oophorectomy.  She understands the indications for her surgical procedure  and she accepts the risks of, but not limited to, anesthetic complications,  bleeding, infections, and possible damage to the surrounding organs.  Janine Limbo, M.D.    AVS/MEDQ  D:  09/05/2002  T:  09/06/2002  Job:  [id number]

## 2010-09-22 ENCOUNTER — Telehealth: Payer: Self-pay | Admitting: *Deleted

## 2010-09-22 NOTE — Telephone Encounter (Signed)
Pt requesting blood type, states it is in her chart.

## 2010-09-23 NOTE — Telephone Encounter (Signed)
Please call a blood type is not a screening test,,,,,,,, it is typically done at the [facility] when folks donate blood

## 2010-09-24 NOTE — Telephone Encounter (Signed)
patient  Is aware 

## 2011-05-11 ENCOUNTER — Other Ambulatory Visit: Payer: Self-pay | Admitting: Family Medicine

## 2011-05-15 ENCOUNTER — Emergency Department (HOSPITAL_COMMUNITY): Payer: 59

## 2011-05-15 ENCOUNTER — Other Ambulatory Visit: Payer: Self-pay

## 2011-05-15 ENCOUNTER — Encounter (HOSPITAL_COMMUNITY): Payer: Self-pay | Admitting: *Deleted

## 2011-05-15 ENCOUNTER — Inpatient Hospital Stay (HOSPITAL_COMMUNITY)
Admission: EM | Admit: 2011-05-15 | Discharge: 2011-05-20 | DRG: 184 | Disposition: A | Discharge status: Home / Self Care | Payer: 59 | Attending: General Surgery | Admitting: General Surgery

## 2011-05-15 DIAGNOSIS — S42102A Fracture of unspecified part of scapula, left shoulder, initial encounter for closed fracture: Secondary | ICD-10-CM | POA: Diagnosis present

## 2011-05-15 DIAGNOSIS — F17200 Nicotine dependence, unspecified, uncomplicated: Secondary | ICD-10-CM | POA: Diagnosis present

## 2011-05-15 DIAGNOSIS — S27899A Unspecified injury of other specified intrathoracic organs, initial encounter: Secondary | ICD-10-CM | POA: Diagnosis present

## 2011-05-15 DIAGNOSIS — S270XXA Traumatic pneumothorax, initial encounter: Secondary | ICD-10-CM

## 2011-05-15 DIAGNOSIS — J939 Pneumothorax, unspecified: Secondary | ICD-10-CM | POA: Diagnosis present

## 2011-05-15 DIAGNOSIS — E669 Obesity, unspecified: Secondary | ICD-10-CM | POA: Diagnosis present

## 2011-05-15 DIAGNOSIS — S20219A Contusion of unspecified front wall of thorax, initial encounter: Secondary | ICD-10-CM | POA: Diagnosis present

## 2011-05-15 DIAGNOSIS — J45909 Unspecified asthma, uncomplicated: Secondary | ICD-10-CM | POA: Diagnosis present

## 2011-05-15 DIAGNOSIS — S2249XA Multiple fractures of ribs, unspecified side, initial encounter for closed fracture: Secondary | ICD-10-CM

## 2011-05-15 DIAGNOSIS — E278 Other specified disorders of adrenal gland: Secondary | ICD-10-CM | POA: Diagnosis present

## 2011-05-15 DIAGNOSIS — S42109A Fracture of unspecified part of scapula, unspecified shoulder, initial encounter for closed fracture: Secondary | ICD-10-CM

## 2011-05-15 DIAGNOSIS — F17210 Nicotine dependence, cigarettes, uncomplicated: Secondary | ICD-10-CM | POA: Diagnosis present

## 2011-05-15 DIAGNOSIS — E279 Disorder of adrenal gland, unspecified: Secondary | ICD-10-CM | POA: Diagnosis present

## 2011-05-15 DIAGNOSIS — I119 Hypertensive heart disease without heart failure: Secondary | ICD-10-CM | POA: Diagnosis present

## 2011-05-15 DIAGNOSIS — S2242XA Multiple fractures of ribs, left side, initial encounter for closed fracture: Secondary | ICD-10-CM | POA: Diagnosis present

## 2011-05-15 LAB — CBC
Hemoglobin: 13.3 g/dL (ref 12.0–15.0)
MCH: 28.9 pg (ref 26.0–34.0)
MCV: 84.6 fL (ref 78.0–100.0)
Platelets: 288 10*3/uL (ref 150–400)
RBC: 4.6 MIL/uL (ref 3.87–5.11)
WBC: 12.1 10*3/uL — ABNORMAL HIGH (ref 4.0–10.5)

## 2011-05-15 LAB — POCT I-STAT, CHEM 8
BUN: 19 mg/dL (ref 6–23)
Chloride: 103 mEq/L (ref 96–112)
Creatinine, Ser: 0.9 mg/dL (ref 0.50–1.10)
Potassium: 3.2 mEq/L — ABNORMAL LOW (ref 3.5–5.1)
Sodium: 142 mEq/L (ref 135–145)

## 2011-05-15 LAB — ETHANOL: Alcohol, Ethyl (B): <11 mg/dL (ref 0–11)

## 2011-05-15 LAB — COMPREHENSIVE METABOLIC PANEL
AST: 38 U/L — ABNORMAL HIGH (ref 0–37)
Albumin: 4 g/dL (ref 3.5–5.2)
BUN: 19 mg/dL (ref 6–23)
Calcium: 9.7 mg/dL (ref 8.4–10.5)
Chloride: 102 mEq/L (ref 96–112)
Creatinine, Ser: 0.73 mg/dL (ref 0.50–1.10)
Total Bilirubin: 0.3 mg/dL (ref 0.3–1.2)

## 2011-05-15 LAB — POCT I-STAT TROPONIN I: Troponin i, poc: 0 ng/mL (ref 0.00–0.08)

## 2011-05-15 LAB — LACTIC ACID, PLASMA: Lactic Acid, Venous: 2.3 mmol/L — ABNORMAL HIGH (ref 0.5–2.2)

## 2011-05-15 LAB — PROTIME-INR: INR: 0.95 (ref 0.00–1.49)

## 2011-05-15 MED ORDER — HYDROMORPHONE HCL PF 1 MG/ML IJ SOLN
1.0000 mg | Freq: Once | INTRAMUSCULAR | Status: AC
  Administered 2011-05-15: 1 mg via INTRAVENOUS
  Filled 2011-05-15: qty 1

## 2011-05-15 MED ORDER — ENOXAPARIN SODIUM 40 MG/0.4ML ~~LOC~~ SOLN
40.0000 mg | SUBCUTANEOUS | Status: DC
  Administered 2011-05-16 – 2011-05-20 (×5): 40 mg via SUBCUTANEOUS
  Filled 2011-05-15 (×6): qty 0.4

## 2011-05-15 MED ORDER — CYCLOBENZAPRINE HCL 10 MG PO TABS
10.0000 mg | ORAL_TABLET | Freq: Three times a day (TID) | ORAL | Status: DC
  Administered 2011-05-15 – 2011-05-20 (×15): 10 mg via ORAL
  Filled 2011-05-15 (×25): qty 1

## 2011-05-15 MED ORDER — OXYCODONE HCL 5 MG PO TABS
5.0000 mg | ORAL_TABLET | ORAL | Status: DC | PRN
  Administered 2011-05-15 – 2011-05-18 (×7): 5 mg via ORAL
  Filled 2011-05-15 (×6): qty 1

## 2011-05-15 MED ORDER — MORPHINE SULFATE 2 MG/ML IJ SOLN
2.0000 mg | INTRAMUSCULAR | Status: DC | PRN
  Administered 2011-05-15: 2 mg via INTRAVENOUS
  Filled 2011-05-15: qty 1

## 2011-05-15 MED ORDER — TETANUS-DIPHTHERIA TOXOIDS TD 5-2 LFU IM INJ
0.5000 mL | INJECTION | Freq: Once | INTRAMUSCULAR | Status: AC
  Administered 2011-05-15: 0.5 mL via INTRAMUSCULAR
  Filled 2011-05-15 (×3): qty 0.5

## 2011-05-15 MED ORDER — SODIUM CHLORIDE 0.9 % IV BOLUS (SEPSIS)
1000.0000 mL | Freq: Once | INTRAVENOUS | Status: AC
  Administered 2011-05-15: 1000 mL via INTRAVENOUS

## 2011-05-15 MED ORDER — POTASSIUM CHLORIDE IN NACL 20-0.9 MEQ/L-% IV SOLN
INTRAVENOUS | Status: DC
  Administered 2011-05-15: 22:00:00 via INTRAVENOUS
  Administered 2011-05-16: 75 mL via INTRAVENOUS
  Administered 2011-05-17: 04:00:00 via INTRAVENOUS
  Filled 2011-05-15 (×4): qty 1000

## 2011-05-15 MED ORDER — OXYCODONE HCL 5 MG PO TABS
10.0000 mg | ORAL_TABLET | ORAL | Status: DC | PRN
  Administered 2011-05-17 – 2011-05-20 (×9): 10 mg via ORAL
  Filled 2011-05-15 (×11): qty 2

## 2011-05-15 MED ORDER — DOCUSATE SODIUM 100 MG PO CAPS
100.0000 mg | ORAL_CAPSULE | Freq: Two times a day (BID) | ORAL | Status: DC
  Administered 2011-05-15 – 2011-05-16 (×3): 100 mg via ORAL
  Filled 2011-05-15 (×3): qty 1

## 2011-05-15 MED ORDER — MIDAZOLAM HCL 2 MG/2ML IJ SOLN
INTRAMUSCULAR | Status: AC
  Filled 2011-05-15: qty 2

## 2011-05-15 MED ORDER — ONDANSETRON HCL 4 MG/2ML IJ SOLN: 4.0000 mg | Freq: Four times a day (QID) | INTRAMUSCULAR | Status: DC | PRN

## 2011-05-15 MED ORDER — IOHEXOL 300 MG/ML  SOLN
100.0000 mL | Freq: Once | INTRAMUSCULAR | Status: AC | PRN
  Administered 2011-05-15: 100 mL via INTRAVENOUS

## 2011-05-15 MED ORDER — TRIAMTERENE-HCTZ 37.5-25 MG PO TABS
1.0000 | ORAL_TABLET | ORAL | Status: DC
  Administered 2011-05-16 – 2011-05-20 (×5): 1 via ORAL
  Filled 2011-05-15 (×6): qty 1

## 2011-05-15 MED ORDER — HYDROMORPHONE HCL PF 1 MG/ML IJ SOLN: 1.0000 mg | INTRAMUSCULAR | Status: DC | PRN

## 2011-05-15 MED ORDER — BISACODYL 10 MG RE SUPP: 10.0000 mg | Freq: Every day | RECTAL | Status: DC | PRN

## 2011-05-15 MED ORDER — PANTOPRAZOLE SODIUM 40 MG PO TBEC
40.0000 mg | DELAYED_RELEASE_TABLET | Freq: Every day | ORAL | Status: DC
  Administered 2011-05-15 – 2011-05-16 (×2): 40 mg via ORAL
  Filled 2011-05-15 (×2): qty 1

## 2011-05-15 MED ORDER — OXYCODONE HCL 5 MG PO TABS: 2.5000 mg | ORAL_TABLET | ORAL | Status: DC | PRN

## 2011-05-15 MED ORDER — FENTANYL CITRATE 0.05 MG/ML IJ SOLN
150.0000 ug | Freq: Once | INTRAMUSCULAR | Status: AC
  Administered 2011-05-15: 100 ug via INTRAVENOUS
  Filled 2011-05-15: qty 4

## 2011-05-15 MED ORDER — FENTANYL CITRATE 0.05 MG/ML IJ SOLN
100.0000 ug | Freq: Once | INTRAMUSCULAR | Status: AC
  Administered 2011-05-15: 100 ug via INTRAVENOUS

## 2011-05-15 MED ORDER — ONDANSETRON HCL 4 MG PO TABS: 4.0000 mg | ORAL_TABLET | Freq: Four times a day (QID) | ORAL | Status: DC | PRN

## 2011-05-15 MED ORDER — PANTOPRAZOLE SODIUM 40 MG IV SOLR
40.0000 mg | Freq: Every day | INTRAVENOUS | Status: DC
  Filled 2011-05-15 (×2): qty 40
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
## 2011-05-15 NOTE — Progress Notes (Signed)
Orthopedic Tech Progress Note Patient Details:  Alison [name] 1956/02/20 [id number]  Patient ID: Alison [name], female   DOB: [birth year], 56 y.o.   MRN: [id number]  Made ortho visit [name] 05/15/2011, 3:28 PM

## 2011-05-15 NOTE — ED Notes (Signed)
Dr. Andrey Campanile at bedside, pt informed of chest tube insertion procedure, informed consent obtained, pt on cardiac monitor, pt verbalizes understanding of procedure, appropriate equipment at bedside
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
## 2011-05-15 NOTE — ED Notes (Signed)
Dr.Rancour given copy of ecg.

## 2011-05-15 NOTE — ED Notes (Signed)
Called and gave report to Falcon.

## 2011-05-15 NOTE — ED Notes (Signed)
Chest tube insertion complete, Gaynelle Adu MD remains at bedside, pt tolerated procedure well, Chest tube on L side, 32 French @ 13 cm mark, to have X ray verify placement

## 2011-05-15 NOTE — Progress Notes (Signed)
Chest Tube Insertion Procedure Note  Indications:  Clinically significant Pneumothorax  Pre-operative Diagnosis: Pneumothorax - Left  Post-operative Diagnosis: Pneumothorax - Left  Procedure Details  Informed consent was obtained for the procedure, including sedation.  Risks of lung perforation, hemorrhage, arrhythmia, and adverse drug reaction were discussed.   After sterile skin prep with Chloraprep and time-out procedure, using standard technique, a 32 French tube was placed in the left lateral 5 rib space. It was placed at 13cm tube mark and secured to the skin with 0-silk sutures x 2. vaseline gauze and sterile dressing applied. Tube connected to suction. No air leak.   Anesthesia: total of 200 mcg Fentanyl IV and 20cc 1% xylocaine  Findings: Rush of air  Estimated Blood Loss:  Minimal         Specimens:  None              Complications:  None; patient tolerated the procedure well.         Disposition: pt stable in ED         Condition: stable  Attending Attestation: I performed the procedure.  Mary Sella. Andrey Campanile, MD, FACS General, Bariatric, & Minimally Invasive Surgery [facility], [place]
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
## 2011-05-15 NOTE — ED Notes (Signed)
Pt returned from ct

## 2011-05-16 ENCOUNTER — Inpatient Hospital Stay (HOSPITAL_COMMUNITY): Payer: 59

## 2011-05-16 ENCOUNTER — Other Ambulatory Visit: Payer: 59

## 2011-05-16 LAB — BASIC METABOLIC PANEL
BUN: 15 mg/dL (ref 6–23)
Chloride: 100 mEq/L (ref 96–112)
Creatinine, Ser: 0.64 mg/dL (ref 0.50–1.10)
GFR calc Af Amer: >90 mL/min (ref >90)
Glucose, Bld: 122 mg/dL — ABNORMAL HIGH (ref 70–99)

## 2011-05-16 LAB — CBC
HCT: 35 % — ABNORMAL LOW (ref 36.0–46.0)
Hemoglobin: 11.5 g/dL — ABNORMAL LOW (ref 12.0–15.0)
MCHC: 32.9 g/dL (ref 30.0–36.0)
MCV: 85.6 fL (ref 78.0–100.0)
RDW: 13.3 % (ref 11.5–15.5)
WBC: 11.8 10*3/uL — ABNORMAL HIGH (ref 4.0–10.5)

## 2011-05-16 MED ORDER — POTASSIUM CHLORIDE 10 MEQ/100ML IV SOLN
10.0000 meq | INTRAVENOUS | Status: AC
  Administered 2011-05-16 (×2): 10 meq via INTRAVENOUS
  Filled 2011-05-16: qty 100

## 2011-05-16 MED ORDER — POTASSIUM CHLORIDE 10 MEQ/100ML IV SOLN
INTRAVENOUS | Status: AC
  Administered 2011-05-16: 10 meq via INTRAVENOUS
  Filled 2011-05-16: qty 100
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
## 2011-05-16 NOTE — Progress Notes (Signed)
UR of chart complete.  
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
## 2011-05-17 ENCOUNTER — Inpatient Hospital Stay (HOSPITAL_COMMUNITY): Payer: 59

## 2011-05-17 LAB — BASIC METABOLIC PANEL
BUN: 11 mg/dL (ref 6–23)
CO2: 29 mEq/L (ref 19–32)
Calcium: 9.2 mg/dL (ref 8.4–10.5)
Creatinine, Ser: 0.72 mg/dL (ref 0.50–1.10)
GFR calc non Af Amer: >90 mL/min (ref >90)
Glucose, Bld: 104 mg/dL — ABNORMAL HIGH (ref 70–99)
Sodium: 138 mEq/L (ref 135–145)

## 2011-05-17 LAB — CBC
HCT: 35.5 % — ABNORMAL LOW (ref 36.0–46.0)
Hemoglobin: 11.7 g/dL — ABNORMAL LOW (ref 12.0–15.0)
MCH: 28.4 pg (ref 26.0–34.0)
MCHC: 33 g/dL (ref 30.0–36.0)
MCV: 86.2 fL (ref 78.0–100.0)

## 2011-05-17 MED ORDER — DOCUSATE SODIUM 100 MG PO CAPS
200.0000 mg | ORAL_CAPSULE | Freq: Two times a day (BID) | ORAL | Status: DC
  Administered 2011-05-17 – 2011-05-20 (×7): 200 mg via ORAL
  Filled 2011-05-17 (×5): qty 2
  Filled 2011-05-17: qty 1
  Filled 2011-05-17: qty 2

## 2011-05-17 MED ORDER — MORPHINE SULFATE 2 MG/ML IJ SOLN
2.0000 mg | INTRAMUSCULAR | Status: DC | PRN
  Administered 2011-05-20: 2 mg via INTRAVENOUS
  Filled 2011-05-17: qty 1

## 2011-05-17 MED ORDER — POLYETHYLENE GLYCOL 3350 17 G PO PACK
17.0000 g | PACK | Freq: Every day | ORAL | Status: DC
  Administered 2011-05-17 – 2011-05-20 (×4): 17 g via ORAL
  Filled 2011-05-17 (×4): qty 1
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
## 2011-05-17 NOTE — Progress Notes (Signed)
Pt tolerating diet.  No shortness of breath. Abd soft. Plan chest tube to water seal and transfer to floor.
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
## 2011-05-18 ENCOUNTER — Inpatient Hospital Stay (HOSPITAL_COMMUNITY): Payer: 59

## 2011-05-18 DIAGNOSIS — S42102A Fracture of unspecified part of scapula, left shoulder, initial encounter for closed fracture: Secondary | ICD-10-CM | POA: Diagnosis present

## 2011-05-18 MED ORDER — BISACODYL 5 MG PO TBEC
10.0000 mg | DELAYED_RELEASE_TABLET | Freq: Every day | ORAL | Status: DC
  Administered 2011-05-18 – 2011-05-20 (×3): 10 mg via ORAL
  Filled 2011-05-18 (×3): qty 2
  Filled 2011-05-18 (×2): qty 1

## 2011-05-18 NOTE — Consult Note (Signed)
I agree with the findings and plan above.  Budd Palmer, MD 05/18/2011 8:43 AM

## 2011-05-18 NOTE — Progress Notes (Signed)
Pt states her right leg feels like it's asleep, tingling but no pain.  PA notified and came up to see pt.  No new orders received.  Will continue to monitor.   Hector Shade [place]

## 2011-05-18 NOTE — Progress Notes (Signed)
The patient has too much drainage from her chest tube to remove it today.  No air leakage.  Will keep on waterseal, but will not remove today.  This patient has been seen and I agree with the findings and treatment plan.  Marta Lamas. Gae Bon, MD, FACS [phone] (pager) [phone] (direct pager) Trauma Surgeon

## 2011-05-18 NOTE — Progress Notes (Signed)
Physical Therapy Treatment Patient Details Name: Alison [name] MRN: [id number] DOB: 1955/04/02 Today's Date: 05/18/2011  PT Assessment/Plan  PT - Assessment/Plan Comments on Treatment Session: Pt eager and motivated to progress with mobility. The lowest read O2 was 86%RA after ambulation. O2 reapplied. RN made aware.  PT Plan: Discharge plan remains appropriate PT Frequency: Min 3X/week Follow Up Recommendations: Home health PT;Supervision - Intermittent Equipment Recommended: Rolling walker with 5" wheels;3 in 1 bedside comode PT Goals  Acute Rehab PT Goals PT Goal: Supine/Side to Sit - Progress: Progressing toward goal PT Goal: Sit to Stand - Progress: Progressing toward goal PT Goal: Stand to Sit - Progress: Progressing toward goal PT Goal: Ambulate - Progress: Progressing toward goal Pt will Go Up / Down Stairs: 3-5 stairs;with least restrictive assistive device;with supervision PT Goal: Up/Down Stairs - Progress: Goal set today  PT Treatment Precautions/Restrictions  Precautions Precautions: Other (comment) (rib fx's) Precaution Comments: Rib Fx Required Braces or Orthoses: No Restrictions Weight Bearing Restrictions: Yes LUE Weight Bearing: Weight bearing as tolerated Mobility (including Balance) Bed Mobility Bed Mobility: Yes Rolling Right: 4: Min assist;With rail Rolling Right Details (indicate cue type and reason): A to facilitate trunk rotation. Cues for technique Right Sidelying to Sit: HOB flat;4: Min assist Right Sidelying to Sit Details (indicate cue type and reason): A for shoulder positioning and cues for log roll technique Transfers Sit to Stand: 5: Supervision;With upper extremity assist;From bed Stand to Sit: 5: Supervision;With upper extremity assist;To bed Ambulation/Gait Ambulation/Gait Assistance: 5: Supervision Ambulation Distance (Feet): 400 Feet Assistive device: Rolling walker Gait Pattern: Decreased stride length Stairs: Yes Stairs  Assistance: 4: Min assist Stairs Assistance Details (indicate cue type and reason): A for safety/balance. Cues for technique Stair Management Technique: One rail Left Number of Stairs: 4     Exercise    End of Session PT - End of Session Equipment Utilized During Treatment: Other (comment) (chest tube/no belt due to rib fx) Activity Tolerance: Patient tolerated treatment well Patient left: in bed;with call bell in reach Nurse Communication: Mobility status for transfers;Mobility status for ambulation General Behavior During Session: [facility] for tasks performed Cognition: [facility] for tasks performed  Fredrich Birks 05/18/2011, 8:46 AM 05/18/2011 Fredrich Birks PTA [phone] pager [phone] office
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
## 2011-05-18 NOTE — Progress Notes (Signed)
Occupational Therapy Treatment Patient Details Name: Alison [name] MRN: [id number] DOB: [birth year] Today's Date: 05/18/2011  OT Assessment/Plan OT Assessment/Plan Comments on Treatment Session: Pt. improved today and with sPo2 of 96% while completing ADL tasks EOB on RA.  OT Plan: Discharge plan remains appropriate OT Frequency: Min 2X/week Follow Up Recommendations: No OT follow up;Supervision - Intermittent Equipment Recommended: Rolling walker with 5" wheels;3 in 1 bedside comode OT Goals Acute Rehab OT Goals OT Goal Formulation: With patient Time For Goal Achievement: 7 days ADL Goals Pt Will Perform Lower Body Bathing: with set-up;with supervision;Sit to stand from chair;with adaptive equipment ADL Goal: Lower Body Bathing - Progress: Progressing toward goals Pt Will Perform Lower Body Dressing: with set-up;with supervision;with adaptive equipment;Sit to stand from chair ADL Goal: Lower Body Dressing - Progress: Progressing toward goals Pt Will Transfer to Toilet: with supervision;Ambulation;3-in-1;with DME ADL Goal: Toilet Transfer - Progress: Progressing toward goals Pt Will Perform Toileting - Hygiene: with set-up;Sit to stand from 3-in-1/toilet ADL Goal: Toileting - Hygiene - Progress: Progressing toward goals Additional ADL Goal #1: Pt's family will demonstrate proper technique for PROM/AAROM of Left shoulder independently.  ADL Goal: Additional Goal #1 - Progress: Progressing toward goals  OT Treatment Precautions/Restrictions  Precautions Precautions: Other (comment) Precaution Comments: Rib Fx Restrictions Weight Bearing Restrictions: Yes LUE Weight Bearing: Weight bearing as tolerated   ADL ADL Grooming: Performed;Wash/dry face;Set up;Supervision/safety Where Assessed - Grooming: Standing at sink Upper Body Bathing: Performed;Chest;Right arm;Left arm;Abdomen;Minimal assistance Upper Body Bathing Details (indicate cue type and reason): Pt. with assist for  under rt. arm due to left rib pain Where Assessed - Upper Body Bathing: Sitting, bed Lower Body Bathing: Simulated;Minimal assistance Lower Body Bathing Details (indicate cue type and reason): Pt able to cross foot over opposite knee to complete Where Assessed - Lower Body Bathing: Sitting, bed Toilet Transfer: Performed;Minimal assistance Toilet Transfer Details (indicate cue type and reason): Min verbal cues for hand placement Toilet Transfer Method: Ambulating Toilet Transfer Equipment: Raised toilet seat with arms (or 3-in-1 over toilet) Ambulation Related to ADLs: Pt. min guard assist with RW ~8' to bathroom ADL Comments: Pt. educated on crossing foot over opposite knee to decrease need for bending forward due to increasing pain. Pt. educated on use of pillow for use of splinting to decrease pain with coughing/and mobility Mobility  Bed Mobility Bed Mobility: No Rolling Right: 4: Min assist;With rail Rolling Right Details (indicate cue type and reason): A to facilitate trunk rotation. Cues for technique Right Sidelying to Sit: HOB flat;4: Min assist Right Sidelying to Sit Details (indicate cue type and reason): A for shoulder positioning and cues for log roll technique Transfers Sit to Stand: 5: Supervision;With upper extremity assist;From bed Stand to Sit: 5: Supervision;With upper extremity assist;To bed     End of Session OT - End of Session Equipment Utilized During Treatment: Gait belt Activity Tolerance: Patient tolerated treatment well Patient left: with call bell in reach;in bed (Sitting EOb with RN present) Nurse Communication: Mobility status for transfers General Behavior During Session: [facility] for tasks performed Cognition: [facility] for tasks performed  Jeovani Weisenburger, OTR/L Pager [phone]  05/18/2011, 11:54 AM

## 2011-05-19 ENCOUNTER — Inpatient Hospital Stay (HOSPITAL_COMMUNITY): Payer: 59

## 2011-05-19 NOTE — Progress Notes (Signed)
Physical Therapy Treatment Patient Details Name: Alison [name] MRN: [id number] DOB: 1955-07-27 Today's Date: 05/19/2011  PT Assessment/Plan  PT - Assessment/Plan Comments on Treatment Session: Pt progressing well and stated that she had been ambulating in the evenings.  PT Plan: Discharge plan remains appropriate PT Frequency: Min 3X/week Follow Up Recommendations: Home health PT;Supervision - Intermittent Equipment Recommended: Rolling walker with 5" wheels;3 in 1 bedside comode PT Goals  Acute Rehab PT Goals PT Goal: Sit to Supine/Side - Progress: Progressing toward goal PT Goal: Sit to Stand - Progress: Progressing toward goal PT Goal: Stand to Sit - Progress: Progressing toward goal PT Goal: Ambulate - Progress: Progressing toward goal PT Goal: Up/Down Stairs - Progress: Progressing toward goal  PT Treatment Precautions/Restrictions  Precautions Precautions: Other (comment) Precaution Comments: Rib Fx Required Braces or Orthoses: No Restrictions Weight Bearing Restrictions: Yes LUE Weight Bearing: Weight bearing as tolerated Mobility (including Balance) Bed Mobility Sit to Sidelying Left: 4: Min assist;HOB flat Sit to Sidelying Left Details (indicate cue type and reason): A for optimal positioning. Cues for technique Transfers Sit to Stand: 6: Modified independent (Device/Increase time) Stand to Sit: 6: Modified independent (Device/Increase time) Ambulation/Gait Ambulation/Gait Assistance: 5: Supervision Ambulation/Gait Assistance Details (indicate cue type and reason): Cues to increase gait speed and for increase step length.  Ambulation Distance (Feet): 400 Feet Assistive device: Rolling walker Gait Pattern: Step-through pattern;Decreased stride length Gait velocity: slower but improving with distance Stairs Assistance: 5: Supervision Stair Management Technique: One rail Right;Forwards Number of Stairs: 4     Exercise    End of Session PT - End of  Session Equipment Utilized During Treatment: Other (comment) (chest tube/no gait belt due to rib fx) Activity Tolerance: Patient tolerated treatment well Patient left: in bed;with call bell in reach Nurse Communication: Mobility status for transfers;Mobility status for ambulation General Behavior During Session: [facility] for tasks performed Cognition: [facility] for tasks performed  Fredrich Birks 05/19/2011, 11:32 AM 05/19/2011 Fredrich Birks PTA [phone] pager [phone] office
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
## 2011-05-19 NOTE — Progress Notes (Signed)
Patient examined and I agree with the assessment and plan  Violeta Gelinas, MD, MPH, FACS Pager: [phone]  05/19/2011 10:27 AM

## 2011-05-19 NOTE — Progress Notes (Signed)
Occupational Therapy Treatment Patient Details Name: Alison [name] MRN: [id number] DOB: 12-Jul-1955 Today's Date: 05/19/2011  OT Assessment/Plan OT Assessment/Plan Comments on Treatment Session: Pt. moving well, however limited by rib fx pain after activity. OT Plan: Discharge plan remains appropriate OT Frequency: Min 2X/week Follow Up Recommendations: No OT follow up;Supervision - Intermittent Equipment Recommended: Rolling walker with 5" wheels;3 in 1 bedside comode OT Goals Acute Rehab OT Goals OT Goal Formulation: With patient ADL Goals Pt Will Perform Lower Body Bathing: with set-up;with supervision;Sit to stand from chair;with adaptive equipment ADL Goal: Lower Body Bathing - Progress: Progressing toward goals Pt Will Perform Lower Body Dressing: with set-up;with supervision;with adaptive equipment;Sit to stand from chair ADL Goal: Lower Body Dressing - Progress: Progressing toward goals Pt Will Transfer to Toilet: with supervision;Ambulation;3-in-1;with DME ADL Goal: Toilet Transfer - Progress: Partly met Pt Will Perform Toileting - Hygiene: with set-up;Sit to stand from 3-in-1/toilet ADL Goal: Toileting - Hygiene - Progress: Partly met Additional ADL Goal #1: Pt's family will demonstrate proper technique for PROM/AAROM of Left shoulder independently.  ADL Goal: Additional Goal #1 - Progress: Progressing toward goals  OT Treatment Precautions/Restrictions  Precautions Precautions: Other (comment) Precaution Comments: Rib Fx Restrictions Weight Bearing Restrictions: Yes LUE Weight Bearing: Weight bearing as tolerated   ADL ADL Grooming: Performed;Wash/dry hands;Supervision/safety;Set up Where Assessed - Grooming: Standing at sink Mobility  Bed Mobility Bed Mobility: Yes Sit to Sidelying Left: 4: Min assist;HOB flat Sit to Sidelying Left Details (indicate cue type and reason): Mod verbal cues for technique to decrease pain level Transfers Transfers: Yes Sit to  Stand: 6: Modified independent (Device/Increase time) Stand to Sit: 6: Modified independent (Device/Increase time) Exercises Other Exercises Other Exercises: Left UE elbow flex/ext in supine position 8 reps and educated pt. on keeping elbow next to body. Stopped reps due to increased pain on left ribcage at site of fx's.  End of Session OT - End of Session Equipment Utilized During Treatment: Gait belt Activity Tolerance: Patient tolerated treatment well Patient left: with call bell in reach;in bed Nurse Communication: Mobility status for transfers General Behavior During Session: [facility] for tasks performed Cognition: [facility] for tasks performed  Saleema Weppler, OTR/L Pager [phone]  05/19/2011, 12:07 PM

## 2011-05-19 NOTE — Progress Notes (Signed)
UR of chart complete.  

## 2011-05-20 ENCOUNTER — Inpatient Hospital Stay (HOSPITAL_COMMUNITY): Payer: 59

## 2011-05-20 MED ORDER — CYCLOBENZAPRINE HCL 10 MG PO TABS: 10.0000 mg | ORAL_TABLET | Freq: Three times a day (TID) | ORAL | Status: AC | PRN

## 2011-05-20 MED ORDER — WHITE PETROLATUM GEL
Status: AC
  Filled 2011-05-20: qty 5

## 2011-05-20 MED ORDER — OXYCODONE-ACETAMINOPHEN 5-325 MG PO TABS: 1.0000 | ORAL_TABLET | ORAL | Status: AC | PRN

## 2011-05-20 MED ORDER — BISACODYL 5 MG PO TBEC: 10.0000 mg | DELAYED_RELEASE_TABLET | Freq: Every day | ORAL | Status: AC

## 2011-05-20 MED ORDER — POLYETHYLENE GLYCOL 3350 17 G PO PACK: 17.0000 g | PACK | Freq: Every day | ORAL | Status: AC

## 2011-05-20 MED ORDER — DSS 100 MG PO CAPS: 200.0000 mg | ORAL_CAPSULE | Freq: Two times a day (BID) | ORAL | Status: AC

## 2011-05-20 NOTE — Discharge Summary (Signed)
Alison Brugger, MD, MPH, FACS Pager: [phone]  

## 2011-05-20 NOTE — Progress Notes (Addendum)
The patient was discharged home with family in a stable condition. Patient discharge education covered and prescription given. The patient verbalized understanding.

## 2011-05-20 NOTE — Progress Notes (Signed)
Pt discharging home. Chooses [facility] for rolling walker, 3-in-1 bedside commode and HHPT/OT. Address and home and mobile numbers confirmed in Epic.
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
## 2011-05-20 NOTE — Progress Notes (Signed)
Physical Therapy Treatment and Discharge Patient Details Name: Alison [name] MRN: [id number] DOB: [birth year] Today's Date: 05/20/2011  PT Assessment/Plan  PT - Assessment/Plan Comments on Treatment Session: Pt awaiting equipment for discharge home.  Reviewed rehab process with pt.  Pt presents with no further questions and is appropriate for discharge to home today.  Acute PT signing off.  PT Plan: Discharge plan remains appropriate PT Frequency: Other (Comment) Follow Up Recommendations: Home health PT;Supervision - Intermittent Equipment Recommended: Rolling walker with 5" wheels;3 in 1 bedside comode PT Goals  Acute Rehab PT Goals PT Goal Formulation: With patient Pt will go Supine/Side to Sit: Independently PT Goal: Supine/Side to Sit - Progress: Not met Pt will go Sit to Supine/Side: Independently PT Goal: Sit to Supine/Side - Progress: Not met Pt will go Sit to Stand: Independently PT Goal: Sit to Stand - Progress: Met Pt will go Stand to Sit: Independently PT Goal: Stand to Sit - Progress: Met Pt will Ambulate: >150 feet;with modified independence;with least restrictive assistive device PT Goal: Ambulate - Progress: Met Pt will Go Up / Down Stairs: 3-5 stairs;with least restrictive assistive device;with supervision PT Goal: Up/Down Stairs - Progress: Partly met Additional Goals Additional Goal #1: Score >/= 52/56 on Berg Balance test  PT Goal: Additional Goal #1 - Progress: Not met  PT Treatment Precautions/Restrictions  Precautions Precautions: Other (comment) Precaution Comments: Rib Fx Required Braces or Orthoses: No Restrictions Weight Bearing Restrictions: Yes LUE Weight Bearing: Weight bearing as tolerated Mobility (including Balance) Bed Mobility Bed Mobility: No Transfers Transfers: Yes Sit to Stand: 7: Independent Stand to Sit: 7: Independent;To chair/3-in-1;With armrests Ambulation/Gait Ambulation/Gait: Yes Ambulation/Gait Assistance: 6: Modified  independent (Device/Increase time) Ambulation Distance (Feet): 150 Feet Assistive device: Rolling walker Gait Pattern: Step-through pattern;Decreased stride length Gait velocity: slower but improving with distance Stairs: Yes Stairs Assistance: 6: Modified independent (Device/Increase time) Stairs Assistance Details (indicate cue type and reason): 1 step with RW sideways.  Stair Management Technique: Step to pattern;Forwards;With walker Number of Stairs: 1  Height of Stairs: 8  Wheelchair Mobility Wheelchair Mobility: No  Posture/Postural Control Posture/Postural Control: No significant limitations Balance Balance Assessed: No Exercise    End of Session PT - End of Session Activity Tolerance: Patient tolerated treatment well Patient left: in chair;with call bell in reach Nurse Communication: Mobility status for transfers;Mobility status for ambulation General Behavior During Session: [facility] for tasks performed Cognition: [facility] for tasks performed  Alison [name] 05/20/2011, 5:17 PM Alison [name] DPT [phone]
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
## 2011-05-20 NOTE — Progress Notes (Signed)
May need lidoderm patch or Lyrica at D/C for cutaneous nerve injury Patient examined and I agree with the assessment and plan  Violeta Gelinas, MD, MPH, FACS Pager: [phone]  05/20/2011 10:32 AM

## 2011-05-20 NOTE — Discharge Instructions (Signed)
Remove chest dressing on Sunday. Then wash daily in shower with soap and water (do not soak). Apply antibiotic ointment and cover with a dry dressing twice daily.  No driving while on oxycodone.  Continue using breathing exerciser.

## 2011-05-23 ENCOUNTER — Telehealth: Payer: Self-pay | Admitting: Orthopedic Surgery

## 2011-05-23 NOTE — Telephone Encounter (Signed)
Alison [name] called to make a follow-up appointment. I explained that there wasn't much we could offer with an office visit for her rib fractures but that we'd be happy to see her if she wished. 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 She has both and will call one of them for an appointment. I told her to call us back if we could help.

## 2011-05-24 ENCOUNTER — Encounter: Payer: 59 | Admitting: Family Medicine

## 2011-05-24 ENCOUNTER — Telehealth: Payer: Self-pay | Admitting: Family Medicine
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
## 2011-05-24 NOTE — Telephone Encounter (Signed)
Please schedule pt Thursday 05/25/10 at 8:00am

## 2011-05-25 NOTE — Telephone Encounter (Signed)
Called pt on 05/24/11 and schedule appt as noted.

## 2011-05-26 ENCOUNTER — Ambulatory Visit (INDEPENDENT_AMBULATORY_CARE_PROVIDER_SITE_OTHER): Payer: No Typology Code available for payment source | Admitting: Family Medicine

## 2011-05-26 ENCOUNTER — Encounter: Payer: Self-pay | Admitting: Family Medicine

## 2011-05-26 ENCOUNTER — Telehealth: Payer: Self-pay | Admitting: Family Medicine

## 2011-05-26 DIAGNOSIS — S2249XA Multiple fractures of ribs, unspecified side, initial encounter for closed fracture: Secondary | ICD-10-CM

## 2011-05-26 DIAGNOSIS — J9383 Other pneumothorax: Secondary | ICD-10-CM

## 2011-05-26 DIAGNOSIS — S2242XA Multiple fractures of ribs, left side, initial encounter for closed fracture: Secondary | ICD-10-CM

## 2011-05-26 DIAGNOSIS — S27899A Unspecified injury of other specified intrathoracic organs, initial encounter: Secondary | ICD-10-CM

## 2011-05-26 DIAGNOSIS — J939 Pneumothorax, unspecified: Secondary | ICD-10-CM

## 2011-05-26 MED ORDER — TRAMADOL HCL 50 MG PO TABS: 50.0000 mg | ORAL_TABLET | Freq: Three times a day (TID) | ORAL | Status: AC | PRN

## 2011-05-26 MED ORDER — HYDROCODONE-ACETAMINOPHEN 7.5-750 MG PO TABS: ORAL_TABLET | ORAL | Status: DC

## 2011-05-26 MED ORDER — VARENICLINE TARTRATE 1 MG PO TABS: ORAL_TABLET | ORAL | Status: DC

## 2011-05-26 NOTE — Telephone Encounter (Signed)
Needs clarification on chantix rx directions do not make sense for the continuing pack.  They state to take 1/2 tab every morning, directions are usually 1 tab bid.  Please clarify.

## 2011-05-26 NOTE — Telephone Encounter (Signed)
Left message on machine for phamacy

## 2011-05-26 NOTE — Patient Instructions (Signed)
Chantix one half tablet every morning and use the nicotine gum or patches  Tramadol 50 mg directions one half to one tablet every 4-6 hours as needed for moderate to mild pain  Vicodin one half to one tablet every 4-6 hours as needed for severe pain  Reserve the Percocet 2 bedtime one half to one tablet at bedtime  We will set you up a consult with neurology for evaluation the numbness in your legs  Return to see me in one week for followup sooner if any problems
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
## 2011-05-30 ENCOUNTER — Telehealth: Payer: Self-pay | Admitting: Family Medicine

## 2011-05-30 ENCOUNTER — Encounter: Payer: Self-pay | Admitting: Neurology

## 2011-05-30 DIAGNOSIS — J939 Pneumothorax, unspecified: Secondary | ICD-10-CM

## 2011-05-30 DIAGNOSIS — S42102A Fracture of unspecified part of scapula, left shoulder, initial encounter for closed fracture: Secondary | ICD-10-CM

## 2011-05-30 DIAGNOSIS — S2242XA Multiple fractures of ribs, left side, initial encounter for closed fracture: Secondary | ICD-10-CM

## 2011-05-30 NOTE — Telephone Encounter (Signed)
Please refer to go for [facility] since she has been there before she can call and make her an appointment she doesn't need a referral from [place]

## 2011-05-30 NOTE — Telephone Encounter (Signed)
Pt called and said that Dr Modesto Charon can not see pt until April 23rd 2013. Pt is req to get a referral to [facility]. Pt has been to that practice approx 3-4 yrs ago.

## 2011-05-31 NOTE — Telephone Encounter (Signed)
Pt called back and said that she already called [facility] and was told that she still would need to get a referral from her pcp, because it has been over 3 years since pt had been there. Pt can not schedule her own appt. Pt req referral be done asap. Pt not sleeping at all.

## 2011-05-31 NOTE — Telephone Encounter (Signed)
Referral request sent 

## 2011-06-02 ENCOUNTER — Encounter: Payer: Self-pay | Admitting: Family Medicine

## 2011-06-02 ENCOUNTER — Ambulatory Visit (INDEPENDENT_AMBULATORY_CARE_PROVIDER_SITE_OTHER): Payer: No Typology Code available for payment source | Admitting: Family Medicine

## 2011-06-02 DIAGNOSIS — S42102A Fracture of unspecified part of scapula, left shoulder, initial encounter for closed fracture: Secondary | ICD-10-CM

## 2011-06-02 DIAGNOSIS — S2242XA Multiple fractures of ribs, left side, initial encounter for closed fracture: Secondary | ICD-10-CM

## 2011-06-02 DIAGNOSIS — S2249XA Multiple fractures of ribs, unspecified side, initial encounter for closed fracture: Secondary | ICD-10-CM

## 2011-06-02 DIAGNOSIS — S42109A Fracture of unspecified part of scapula, unspecified shoulder, initial encounter for closed fracture: Secondary | ICD-10-CM

## 2011-06-02 NOTE — Patient Instructions (Signed)
Continue the tramadol during the day as needed for pain and the Vicodin at bedtime for severe pain  Work with the HR people at work to determine when you can go back to work
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
## 2011-06-06 ENCOUNTER — Telehealth (INDEPENDENT_AMBULATORY_CARE_PROVIDER_SITE_OTHER): Payer: Self-pay | Admitting: General Surgery

## 2011-06-07 NOTE — Telephone Encounter (Signed)
Patient refused occupational therapy

## 2011-06-30 ENCOUNTER — Ambulatory Visit: Payer: No Typology Code available for payment source | Admitting: Family Medicine

## 2011-07-04 ENCOUNTER — Ambulatory Visit (INDEPENDENT_AMBULATORY_CARE_PROVIDER_SITE_OTHER): Payer: No Typology Code available for payment source | Admitting: Family Medicine

## 2011-07-04 ENCOUNTER — Encounter: Payer: Self-pay | Admitting: Family Medicine

## 2011-07-04 ENCOUNTER — Ambulatory Visit: Payer: No Typology Code available for payment source | Admitting: Family Medicine

## 2011-07-04 VITALS — BP 120/80 | Temp 98.7°F | Wt 183.0 lb

## 2011-07-04 DIAGNOSIS — S42102A Fracture of unspecified part of scapula, left shoulder, initial encounter for closed fracture: Secondary | ICD-10-CM

## 2011-07-04 DIAGNOSIS — J9383 Other pneumothorax: Secondary | ICD-10-CM

## 2011-07-04 DIAGNOSIS — S42109A Fracture of unspecified part of scapula, unspecified shoulder, initial encounter for closed fracture: Secondary | ICD-10-CM

## 2011-07-04 DIAGNOSIS — F17200 Nicotine dependence, unspecified, uncomplicated: Secondary | ICD-10-CM

## 2011-07-04 DIAGNOSIS — J939 Pneumothorax, unspecified: Secondary | ICD-10-CM

## 2011-07-04 DIAGNOSIS — S2242XA Multiple fractures of ribs, left side, initial encounter for closed fracture: Secondary | ICD-10-CM

## 2011-07-04 DIAGNOSIS — S27899A Unspecified injury of other specified intrathoracic organs, initial encounter: Secondary | ICD-10-CM

## 2011-07-04 DIAGNOSIS — S2249XA Multiple fractures of ribs, unspecified side, initial encounter for closed fracture: Secondary | ICD-10-CM
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
## 2011-07-04 NOTE — Patient Instructions (Signed)
Continue your current treatment program  Call Dr. Albertha Ghee with [facility] to evaluate the pain in your left shoulder  The disability form should be filled out by your hand surgeon

## 2011-07-05 ENCOUNTER — Other Ambulatory Visit: Payer: Self-pay | Admitting: Family Medicine

## 2011-07-05 DIAGNOSIS — S42102A Fracture of unspecified part of scapula, left shoulder, initial encounter for closed fracture: Secondary | ICD-10-CM

## 2011-07-19 ENCOUNTER — Ambulatory Visit: Payer: No Typology Code available for payment source | Admitting: Neurology

## 2011-07-19 ENCOUNTER — Other Ambulatory Visit (HOSPITAL_COMMUNITY): Payer: Self-pay | Admitting: Orthopedic Surgery

## 2011-07-19 DIAGNOSIS — M25512 Pain in left shoulder: Secondary | ICD-10-CM

## 2011-07-20 ENCOUNTER — Encounter: Payer: Self-pay | Admitting: *Deleted

## 2011-07-22 ENCOUNTER — Ambulatory Visit (HOSPITAL_COMMUNITY)
Admission: RE | Admit: 2011-07-22 | Discharge: 2011-07-22 | Disposition: A | Discharge status: Home / Self Care | Payer: 59 | Source: Ambulatory Visit | Attending: Orthopedic Surgery | Admitting: Orthopedic Surgery

## 2011-07-22 DIAGNOSIS — M719 Bursopathy, unspecified: Secondary | ICD-10-CM | POA: Insufficient documentation

## 2011-07-22 DIAGNOSIS — M67919 Unspecified disorder of synovium and tendon, unspecified shoulder: Secondary | ICD-10-CM | POA: Insufficient documentation

## 2011-07-22 DIAGNOSIS — M25512 Pain in left shoulder: Secondary | ICD-10-CM

## 2011-08-06 NOTE — Progress Notes (Signed)
Seth Friedlander (Cheek) Glenys Snader PT, DPT Acute Rehabilitation [phone]  

## 2011-09-15 ENCOUNTER — Ambulatory Visit (INDEPENDENT_AMBULATORY_CARE_PROVIDER_SITE_OTHER): Payer: 59 | Admitting: Emergency Medicine

## 2011-09-15 ENCOUNTER — Ambulatory Visit: Payer: 59

## 2011-09-15 VITALS — BP 137/79 | HR 97 | Temp 97.3°F | Resp 18 | Ht 66.5 in | Wt 184.0 lb

## 2011-09-15 DIAGNOSIS — R222 Localized swelling, mass and lump, trunk: Secondary | ICD-10-CM

## 2011-09-15 DIAGNOSIS — D1739 Benign lipomatous neoplasm of skin and subcutaneous tissue of other sites: Secondary | ICD-10-CM

## 2011-09-15 NOTE — Addendum Note (Signed)
Addended by: Lesle Chris A on: 09/15/2011 10:10 AM   Modules accepted: Orders

## 2011-09-15 NOTE — Patient Instructions (Signed)
Lipoma A lipoma is a noncancerous (benign) tumor composed of fat cells. They are usually found under the skin (subcutaneous). A lipoma may occur in any tissue of the body that contains fat. Common areas for lipomas to appear include the back, shoulders, buttocks, and thighs. Lipomas are a very common soft tissue growth. They are soft and grow slowly. Most problems caused by a lipoma depend on where it is growing. DIAGNOSIS  A lipoma can be diagnosed with a physical exam. These tumors rarely become cancerous, but radiographic studies can help determine this for certain. Studies used may include:  Computerized X-ray scans (CT or CAT scan).   Computerized magnetic scans (MRI).  TREATMENT  Small lipomas that are not causing problems may be watched. If a lipoma continues to enlarge or causes problems, removal is often the best treatment. Lipomas can also be removed to improve appearance. Surgery is done to remove the fatty cells and the surrounding capsule. Most often, this is done with medicine that numbs the area (local anesthetic). The removed tissue is examined under a microscope to make sure it is not cancerous. Keep all follow-up appointments with your caregiver. SEEK MEDICAL CARE IF:   The lipoma becomes larger or hard.   The lipoma becomes painful, red, or increasingly swollen. These could be signs of infection or a more serious condition.  Document Released: 03/04/2002 Document Revised: 03/03/2011 Document Reviewed: 08/14/2009 [employer] Patient Information 2012 [employer]. 

## 2011-09-15 NOTE — Progress Notes (Signed)
  Subjective:    Patient ID: Alison [name], female    DOB: 1955/12/16, 56 y.o.   MRN: [id number]  HPI patient enters because she looked in the mirror yesterday and noticed a swelling over her right shoulder. She currently is going to physical therapy for a left shoulder problem.    Review of Systems     Objective:   Physical Exam on examination there is a large greater than orange-sized rubbery freely movable mass anterior to the right shoulder.  [facility] reading (PRIMARY) by  Dr.Jemila Camille there are linear areas present on the right and left side along consistent with atelectatic areas. No mass is seen       Assessment & Plan:      I suspect this mass is a lipoma. Will refer to Gen. surgery and get their opinion regarding removal. I'm not sure about the atelectatic area seen on chest x-ray didn't opinion from the radiologist about these

## 2011-10-12 ENCOUNTER — Ambulatory Visit (INDEPENDENT_AMBULATORY_CARE_PROVIDER_SITE_OTHER): Payer: 59 | Admitting: Surgery

## 2011-11-21 ENCOUNTER — Ambulatory Visit (INDEPENDENT_AMBULATORY_CARE_PROVIDER_SITE_OTHER): Payer: 59 | Admitting: Surgery

## 2011-11-21 ENCOUNTER — Encounter (INDEPENDENT_AMBULATORY_CARE_PROVIDER_SITE_OTHER): Payer: Self-pay | Admitting: Surgery

## 2011-11-21 VITALS — BP 132/80 | HR 60 | Temp 96.9°F | Resp 16 | Ht 65.0 in | Wt 182.0 lb

## 2011-11-21 DIAGNOSIS — R222 Localized swelling, mass and lump, trunk: Secondary | ICD-10-CM

## 2011-11-21 DIAGNOSIS — M7989 Other specified soft tissue disorders: Secondary | ICD-10-CM | POA: Insufficient documentation

## 2011-11-21 NOTE — Patient Instructions (Signed)
Lipoma A lipoma is a noncancerous (benign) tumor composed of fat cells. They are usually found under the skin (subcutaneous). A lipoma may occur in any tissue of the body that contains fat. Common areas for lipomas to appear include the back, shoulders, buttocks, and thighs. Lipomas are a very common soft tissue growth. They are soft and grow slowly. Most problems caused by a lipoma depend on where it is growing. DIAGNOSIS  A lipoma can be diagnosed with a physical exam. These tumors rarely become cancerous, but radiographic studies can help determine this for certain. Studies used may include:  Computerized X-ray scans (CT or CAT scan).   Computerized magnetic scans (MRI).  TREATMENT  Small lipomas that are not causing problems may be watched. If a lipoma continues to enlarge or causes problems, removal is often the best treatment. Lipomas can also be removed to improve appearance. Surgery is done to remove the fatty cells and the surrounding capsule. Most often, this is done with medicine that numbs the area (local anesthetic). The removed tissue is examined under a microscope to make sure it is not cancerous. Keep all follow-up appointments with your caregiver. SEEK MEDICAL CARE IF:   The lipoma becomes larger or hard.   The lipoma becomes painful, red, or increasingly swollen. These could be signs of infection or a more serious condition.  Document Released: 03/04/2002 Document Revised: 03/03/2011 Document Reviewed: 08/14/2009 [employer] Patient Information 2012 [employer]. 
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
## 2012-05-02 ENCOUNTER — Encounter: Payer: Self-pay | Admitting: Internal Medicine

## 2012-05-02 ENCOUNTER — Ambulatory Visit (INDEPENDENT_AMBULATORY_CARE_PROVIDER_SITE_OTHER)
Admission: RE | Admit: 2012-05-02 | Discharge: 2012-05-02 | Disposition: A | Discharge status: Home / Self Care | Payer: 59 | Source: Ambulatory Visit | Attending: Internal Medicine | Admitting: Internal Medicine

## 2012-05-02 ENCOUNTER — Ambulatory Visit (INDEPENDENT_AMBULATORY_CARE_PROVIDER_SITE_OTHER): Payer: 59 | Admitting: Internal Medicine

## 2012-05-02 VITALS — BP 120/72 | HR 82 | Temp 97.9°F | Ht 65.5 in | Wt 184.2 lb

## 2012-05-02 DIAGNOSIS — R05 Cough: Secondary | ICD-10-CM

## 2012-05-02 DIAGNOSIS — R059 Cough, unspecified: Secondary | ICD-10-CM | POA: Insufficient documentation

## 2012-05-02 DIAGNOSIS — F17200 Nicotine dependence, unspecified, uncomplicated: Secondary | ICD-10-CM

## 2012-05-02 MED ORDER — PREDNISONE (PAK) 10 MG PO TABS: ORAL_TABLET | ORAL | Status: DC

## 2012-05-02 MED ORDER — AZITHROMYCIN 250 MG PO TABS: ORAL_TABLET | ORAL | Status: DC

## 2012-05-02 NOTE — Progress Notes (Signed)
  Subjective:    Patient ID: Alison [name], female    DOB: [birth year]   MRN: [id number]  HPI  49 yobf smoker wseen by pulmonary  2010 with intermittent asthma and nl pft's   05/02/2012 1st pulmonary ov in EPIC era cc great off all meds until 2 weeks prior to OV  Acute onset ear congestion/ popping then one week prior to OV  Noct wheeze,  Changed to darker mucus 5 days prior to OV.  Severe cough to point of can't get her breath, no better with inhalers.  No obvious daytime variabilty or   cp or chest tightness, subjective wheeze overt sinus or hb symptoms. No unusual exp hx   Sleeping ok without nocturnal  or early am exacerbation  of respiratory  c/o's or need for noct saba. 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  HENT: Positive for ear pain, congestion, sore throat, rhinorrhea and sinus pressure. Negative for nosebleeds, sneezing, trouble swallowing, dental problem and postnasal drip.   Eyes: Negative for redness and itching.  Respiratory: Positive for cough, chest tightness and wheezing. Negative for shortness of breath.   Cardiovascular: Negative for palpitations and leg swelling.  Gastrointestinal: Negative for nausea and vomiting.  Genitourinary: Negative for dysuria.  Musculoskeletal: Negative for joint swelling.  Skin: Negative for rash.  Neurological: Negative for  headaches.  Hematological: Does not bruise/bleed easily.  Psychiatric/Behavioral: Negative for dysphoric mood. The patient is not nervous/anxious.        Objective:   Physical Exam  amb bf nad Wt Readings from Last 3 Encounters:  05/02/12 184 lb 3.2 oz (83.553 kg)  11/21/11 182 lb (82.555 kg)  09/15/11 184 lb (83.462 kg)    HEENT: nl dentition, turbinates, and orophanx. Nl external ear canals without cough reflex   NECK :  without JVD/Nodes/TM/ nl carotid upstrokes bilaterally   LUNGS: no acc muscle use, clear to A and P bilaterally without cough on insp or exp maneuvers   CV:  RRR  no s3 or murmur or increase in P2, no edema   ABD:  soft and nontender with nl excursion in the supine position. No bruits or organomegaly, bowel sounds nl  MS:  warm without deformities, calf tenderness, cyanosis or clubbing  SKIN: warm and dry without lesions    NEURO:  alert, approp, no deficits    CXR  05/02/2012 :   No active lung disease. Linear scarring bilaterally. Healed left upper rib fractures.        Assessment & Plan:

## 2012-05-02 NOTE — Patient Instructions (Addendum)
Please remember to go to the x-ray department downstairs for your tests - we will call you with the results when they are available.  Zpak and prednisone will be called in  For cough delsym supplement with vicodin if needed  The key is to stop smoking completely before smoking completely stops you!   GERD (REFLUX)  is an extremely common cause of respiratory symptoms, many times with no significant heartburn at all.    It can be treated with medication, but also with lifestyle changes including avoidance of late meals, excessive alcohol, smoking cessation, and avoid fatty foods, chocolate, peppermint, colas, red wine, and acidic juices such as orange juice.  NO MINT OR MENTHOL PRODUCTS SO NO COUGH DROPS  USE SUGARLESS CANDY INSTEAD (jolley ranchers or Stover's)  NO OIL BASED VITAMINS - use powdered substitutes.   If your throat or cough not improving Try prilosec 20mg   Take 30-60 min before first meal of the day and Pepcid 20 mg one bedtime until cough is completely gone for at least a week without the need for cough suppression    Late add: needs pft's / ov 6 weeks

## 2012-05-03 ENCOUNTER — Telehealth: Payer: Self-pay | Admitting: Internal Medicine

## 2012-05-03 NOTE — Telephone Encounter (Signed)
lmomtcb x1 for pt 

## 2012-05-03 NOTE — Telephone Encounter (Signed)
Call pt to arrange f/u ov in 4-6 weeks with pfts same day- let her know I reviewed her record and last set was 4 years ago so needs to be updated  Try to quit smoking in meantime!

## 2012-05-03 NOTE — Telephone Encounter (Signed)
Pt returned phone call.  I advised pt that MW would like for pt have a f/u ov in 4-6 weeks with pfts same day. That MW was able to review pt's record & looks like this needs to be updated.  Pt verbalized understanding & states that she will call back tomorrow to schedule these appointments.  Strongly encouraged pt to quit smoking in meantime.  Alison [name]

## 2012-05-03 NOTE — Assessment & Plan Note (Addendum)
Not clear at all she really has asthma at this point having stopped inhalers for years and now not working to control her symptoms back on them with clear exam.  Symptoms are markedly disproportionate to objective findings and not clear this is a lung problem but pt does appear to have difficult airway management issues.   Most likely this is  Classic Upper airway cough syndrome, so named because it's frequently impossible to sort out how much is  CR/sinusitis with freq throat clearing (which can be related to primary GERD)   vs  causing  secondary (" extra esophageal")  GERD from wide swings in gastric pressure that occur with throat clearing, often  promoting self use of mint and menthol lozenges that reduce the lower esophageal sphincter tone and exacerbate the problem further in a cyclical fashion.   These are the same pts (now being labeled as having "irritable larynx syndrome" by some cough centers) who not infrequently have a history of having failed to tolerate ace inhibitors,  dry powder inhalers or biphosphonates or report having atypical reflux symptoms that don't respond to standard doses of PPI , and are easily confused as having aecopd or asthma flares by even experienced allergists/ pulmonologists.  For now max rx for cyclical cough then regroup with pft's  Smoking addressed separately

## 2012-05-03 NOTE — Assessment & Plan Note (Signed)
>   3 min I reviewed the Flethcher curve with patient that basically indicates  if you quit smoking when your best day FEV1 is still well preserved (which hers was in 2010 and appears to be now, though repeat pft's pending) it is highly unlikely you will progress to severe disease and informed the patient there was no medication on the market that has proven to change the curve or the likelihood of progression.  Therefore stopping smoking and maintaining abstinence is the most important aspect of care, not choice of inhalers or for that matter, doctors.

## 2012-05-04 NOTE — Telephone Encounter (Signed)
Also tried calling pt today regarding results. Will hold in leslie's box so I can follow-up Monday.

## 2012-05-11 ENCOUNTER — Other Ambulatory Visit: Payer: Self-pay | Admitting: Family Medicine

## 2012-05-17 NOTE — Progress Notes (Signed)
Quick Note:  Spoke with pt and notified of results per Dr. Wert. Pt verbalized understanding and denied any questions.  ______ 

## 2012-05-17 NOTE — Telephone Encounter (Signed)
Spoke with the pt about her results and transferred her to the schedulers to make f/u with MW She already had appt with PFT's

## 2012-06-25 ENCOUNTER — Ambulatory Visit (INDEPENDENT_AMBULATORY_CARE_PROVIDER_SITE_OTHER): Payer: 59 | Admitting: Internal Medicine

## 2012-06-25 ENCOUNTER — Ambulatory Visit: Payer: 59 | Admitting: Internal Medicine

## 2012-06-25 DIAGNOSIS — R059 Cough, unspecified: Secondary | ICD-10-CM

## 2012-06-25 DIAGNOSIS — R05 Cough: Secondary | ICD-10-CM

## 2012-06-25 LAB — PULMONARY FUNCTION TEST

## 2012-06-25 NOTE — Progress Notes (Signed)
PFT done today. 

## 2012-06-27 ENCOUNTER — Encounter: Payer: Self-pay | Admitting: Internal Medicine

## 2012-06-28 ENCOUNTER — Encounter: Payer: Self-pay | Admitting: Internal Medicine

## 2012-06-28 ENCOUNTER — Ambulatory Visit (INDEPENDENT_AMBULATORY_CARE_PROVIDER_SITE_OTHER): Payer: 59 | Admitting: Internal Medicine

## 2012-06-28 VITALS — BP 130/80 | HR 76 | Temp 97.0°F | Ht 65.0 in | Wt 179.0 lb

## 2012-06-28 DIAGNOSIS — J45909 Unspecified asthma, uncomplicated: Secondary | ICD-10-CM

## 2012-06-28 DIAGNOSIS — F17200 Nicotine dependence, unspecified, uncomplicated: Secondary | ICD-10-CM

## 2012-06-28 MED ORDER — ALBUTEROL SULFATE HFA 108 (90 BASE) MCG/ACT IN AERS: 2.0000 | INHALATION_SPRAY | Freq: Four times a day (QID) | RESPIRATORY_TRACT | Status: DC | PRN

## 2012-06-28 NOTE — Patient Instructions (Addendum)
You do not have bad lung function and you likely never will if you stop smoking now  If your breathing worsens or you need to use your rescue inhaler more than twice weekly or wake up more than twice a month with any respiratory symptoms or require more than two rescue inhalers per year, we need to see you right away because this means we're not controlling the underlying problem (inflammation) adequately.  Rescue inhalers do not control inflammation and overuse can lead to unnecessary and costly consequences.  They can make you feel better temporarily but eventually they will quit working effectively much as sleep aids lead to more insomnia if used regularly.    If you are satisfied with your treatment plan let your doctor know and he/she can either refill your medications or you can return here when your prescription runs out.     If in any way you are not 100% satisfied,  please tell us.  If 100% better, tell your friends!

## 2012-06-28 NOTE — Progress Notes (Signed)
  Subjective:    Patient ID: Alison [name], female    DOB: 1955/09/18   MRN: [id number]  HPI  29 yobf smoker wseen by pulmonary  2010 with intermittent asthma and nl pft's   05/02/2012 1st pulmonary ov in EPIC era cc great off all meds until 2 weeks prior to OV  Acute onset ear congestion/ popping then one week prior to OV  Noct wheeze,  Changed to darker mucus 5 days prior to OV.  Severe cough to point of can't get her breath, no better with inhalers. rec Pred/zpak/ no smoke   06/28/2012 f/u ov/Avarose Mervine Chief Complaint  Patient presents with  . Follow-up    Cough much better, only bothers her first thing in the am, prod with minimal white sputum     No obvious daytime variabilty or   cp or chest tightness, subjective wheeze overt sinus or hb symptoms. No unusual exp hx   Sleeping ok without nocturnal  or early am exacerbation  of respiratory  c/o's or need for noct saba. Also denies any obvious fluctuation of symptoms with weather or environmental changes or other aggravating or alleviating factors except as outlined above  ROS  The following are not active complaints unless bolded sore throat, dysphagia, dental problems, itching, sneezing,  nasal congestion or excess/ purulent secretions, ear ache,   fever, chills, sweats, unintended wt loss, pleuritic or exertional cp, hemoptysis,  orthopnea pnd or leg swelling, presyncope, palpitations, heartburn, abdominal pain, anorexia, nausea, vomiting, diarrhea  or change in bowel or urinary habits, change in stools or urine, dysuria,hematuria,  rash, arthralgias, visual complaints, headache, numbness weakness or ataxia or problems with walking or coordination,  change in mood/affect or memory.         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 Nl external ear canals without cough reflex   NECK :  without JVD/Nodes/TM/ nl carotid upstrokes bilaterally   LUNGS: no acc muscle use, clear to A and P bilaterally without cough on insp or exp maneuvers   CV:  RRR  no s3 or murmur or increase in P2, no edema   ABD:  soft and nontender with nl excursion in the supine position. No bruits or organomegaly, bowel sounds nl  MS:  warm without deformities, calf tenderness, cyanosis or clubbing  SKIN: warm and dry without lesions    NEURO:  alert, approp, no deficits    CXR  05/02/2012 :   No active lung disease. Linear scarring bilaterally. Healed left upper rib fractures.        Assessment & Plan:

## 2012-07-01 NOTE — Assessment & Plan Note (Signed)

## 2012-07-01 NOTE — Assessment & Plan Note (Signed)
-   PFT's 06/25/12  FEV1 2.22 (90%) ratio 78 with DLCO 70 corrects to 103  I had an extended summary discussion with the patient today lasting 15 to 20 minutes of a 25 minute visit on the following issues:   I reviewed the Flethcher curve with patient that basically indicates  if you quit smoking when your best day FEV1 is still well preserved (which hers is) it is highly unlikely you will progress to severe disease and informed the patient there was no medication on the market that has proven to change the curve or the likelihood of progression.  Therefore stopping smoking and maintaining abstinence is the most important aspect of care, not choice of inhalers or for that matter, doctors.    She does have asthma but All goals of chronic asthma control met including optimal function and elimination of symptoms with minimal need for rescue therapy.  Contingencies discussed in full including contacting this office immediately if not controlling the symptoms using the rule of two's.     Pulmonary f/u prn

## 2012-07-11 ENCOUNTER — Encounter: Payer: Self-pay | Admitting: Internal Medicine

## 2012-08-20 ENCOUNTER — Other Ambulatory Visit: Payer: Self-pay | Admitting: Family Medicine

## 2012-08-23 ENCOUNTER — Other Ambulatory Visit: Payer: Self-pay | Admitting: Family Medicine

## 2012-11-05 ENCOUNTER — Encounter: Payer: 59 | Admitting: Family

## 2012-11-07 ENCOUNTER — Ambulatory Visit (INDEPENDENT_AMBULATORY_CARE_PROVIDER_SITE_OTHER): Payer: 59 | Admitting: Family

## 2012-11-07 ENCOUNTER — Telehealth: Payer: Self-pay | Admitting: Family Medicine

## 2012-11-07 ENCOUNTER — Other Ambulatory Visit: Payer: Self-pay | Admitting: Family

## 2012-11-07 ENCOUNTER — Encounter: Payer: Self-pay | Admitting: Family

## 2012-11-07 VITALS — BP 128/82 | HR 73 | Ht 66.0 in | Wt 179.0 lb

## 2012-11-07 DIAGNOSIS — B353 Tinea pedis: Secondary | ICD-10-CM

## 2012-11-07 DIAGNOSIS — G4700 Insomnia, unspecified: Secondary | ICD-10-CM

## 2012-11-07 DIAGNOSIS — Z0000 Encounter for general adult medical examination without abnormal findings: Secondary | ICD-10-CM

## 2012-11-07 DIAGNOSIS — I498 Other specified cardiac arrhythmias: Secondary | ICD-10-CM

## 2012-11-07 DIAGNOSIS — I10 Essential (primary) hypertension: Secondary | ICD-10-CM

## 2012-11-07 LAB — POCT URINALYSIS DIPSTICK
Blood, UA: NEGATIVE
Glucose, UA: NEGATIVE
Protein, UA: NEGATIVE
Spec Grav, UA: 1.015
Urobilinogen, UA: 0.2
pH, UA: 8

## 2012-11-07 LAB — LIPID PANEL
Cholesterol: 189 mg/dL (ref 0–200)
HDL: 39.2 mg/dL (ref >39.00)
Triglycerides: 119 mg/dL (ref 0.0–149.0)

## 2012-11-07 LAB — BASIC METABOLIC PANEL
BUN: 19 mg/dL (ref 6–23)
CO2: 29 mEq/L (ref 19–32)
Calcium: 9.4 mg/dL (ref 8.4–10.5)
Chloride: 100 mEq/L (ref 96–112)
Creatinine, Ser: 0.8 mg/dL (ref 0.4–1.2)

## 2012-11-07 LAB — CBC WITH DIFFERENTIAL/PLATELET
Basophils Absolute: 0 10*3/uL (ref 0.0–0.1)
Eosinophils Absolute: 0.1 10*3/uL (ref 0.0–0.7)
HCT: 39.3 % (ref 36.0–46.0)
Hemoglobin: 12.8 g/dL (ref 12.0–15.0)
Lymphs Abs: 2.5 10*3/uL (ref 0.7–4.0)
MCHC: 32.6 g/dL (ref 30.0–36.0)
MCV: 88.2 fl (ref 78.0–100.0)
Monocytes Absolute: 0.5 10*3/uL (ref 0.1–1.0)
Neutro Abs: 3.7 10*3/uL (ref 1.4–7.7)
RDW: 13.3 % (ref 11.5–14.6)

## 2012-11-07 LAB — HEPATIC FUNCTION PANEL
ALT: 18 U/L (ref 0–35)
Total Bilirubin: 0.7 mg/dL (ref 0.3–1.2)
Total Protein: 7.7 g/dL (ref 6.0–8.3)

## 2012-11-07 MED ORDER — TRIAMTERENE-HCTZ 37.5-25 MG PO TABS: 1.0000 | ORAL_TABLET | ORAL | Status: DC

## 2012-11-07 MED ORDER — CLOTRIMAZOLE-BETAMETHASONE 1-0.05 % EX CREA: TOPICAL_CREAM | Freq: Two times a day (BID) | CUTANEOUS | Status: DC

## 2012-11-07 NOTE — Progress Notes (Signed)
  Subjective:    Patient ID: Alison [name], female    DOB: Dec 02, 1955, 57 y.o.   MRN: [id number]  HPI This is a routine physical examination for this healthy  Female. Reviewed all health maintenance protocols including mammography colonoscopy bone density and reviewed appropriate screening labs. Her immunization history was reviewed as well as her current medications and allergies refills of her chronic medications were given and the plan for yearly health maintenance was discussed all orders and referrals were made as appropriate.   Review of Systems  Constitutional: Negative.   HENT: Negative.   Eyes: Negative.   Respiratory: Negative.   Cardiovascular: Negative.   Gastrointestinal: Negative.   Endocrine: Negative.   Genitourinary: Negative.   Musculoskeletal: Negative.   Skin: Positive for rash.       Dry, itchy, rash  To the left foot  Allergic/Immunologic: Negative.   Neurological: Negative.   Hematological: Negative.   Psychiatric/Behavioral: Negative.        Objective:   Physical Exam  Constitutional: She is oriented to person, place, and time. She appears well-developed and well-nourished.  HENT:  Head: Normocephalic.  Right Ear: External ear normal.  Left Ear: External ear normal.  Nose: Nose normal.  Mouth/Throat: Oropharynx is clear and moist.  Eyes: Conjunctivae and EOM are normal. Pupils are equal, round, and reactive to light.  Neck: Normal range of motion. Neck supple. No thyromegaly present.  Cardiovascular: Normal rate, regular rhythm and normal heart sounds.   Pulmonary/Chest: Effort normal and breath sounds normal.  Abdominal: Soft. Bowel sounds are normal.  Musculoskeletal: Normal range of motion.  Neurological: She is alert and oriented to person, place, and time. She has normal reflexes. No cranial nerve deficit.  Skin: Skin is warm and dry.  Contract, fungal appearing rash noted to the right foot. Dry in between the toe. Nailbeds intact.   Psychiatric: She has a normal mood and affect.          Assessment & Plan:  1. Complete physical exam 2. Tobacco abuse 3. Tinea of the foot 4. Hypertension  Plan: Continue current medications. Lotrisone cream applied to the foot twice a day. Keep the feet clean and dry. Take steel toe boot off immediately after work. Call the office with any questions or concerns. Recheck in 6 months and sooner as needed.

## 2012-11-07 NOTE — Patient Instructions (Addendum)
Breast Self-Examination You should begin examining your breasts at age 56 even though the risk for breast cancer is low in this age group. It is important to become familiar with how your breasts look and feel. This is true for pregnant women, nursing mothers, women in menopause and women who have breast implants.  Women should examine their breasts once a month to look for changes and lumps. By doing monthly breast exams, you get to know how your breasts feel and how they can change from month to month. This allows you to pick up changes early. It can also offer you some reassurance that your breast health is good. This exam only takes minutes. Most breast lumps are not caused by cancer. If you find a lump, a special x-ray called a mammogram, or other tests may be needed to determine what is wrong.  Some of the signs that a breast lump is caused by cancer include:  Dimpling of the skin or changes in the shape of the breast or nipple.   A dark-colored or bloody discharge from the nipple.   Swollen lymph glands around the breast or in the armpit.   Redness of the breast or nipple.   Scaly nipple or skin on the breast.   Pain or swelling of the breast.  SELF-EXAM There are a few points to follow when doing a thorough breast exam. The best time to examine your breasts is 5 to 7 days after the menstrual period is over. During menstruation, the breasts are lumpier, and it may be more difficult to pick up changes. If you do not menstruate, have reached menopause or had a hysterectomy, examine your breasts the first day of every month. After three to four months, you will become more familiar with the variations of your breasts and more comfortable with the exam.  Perform your breast exam monthly. Keep a written record with breast changes or normal findings for each breast. This makes it easier to be sure of changes and to not solely depend on memory for size, tenderness, or location. Try to do the exam  at the same time each month, and write down where you are in your menstrual cycle if you are still menstruating.   Look at your breasts. Stand in front of a mirror with your hands clasped behind your head. Tighten your chest muscles and look for asymmetry. This means a difference in shape or contour from one breast to the other, such as puckers, dips or bumps. Look also for skin changes.   Lean forward with your hands on your hips. Again, look for symmetry and skin changes.   While showering, soap the breasts, and carefully feel the breasts with fingertips while holding the arm (on the side of the breast being examined) over the head. Do this with each breast carefully feeling for lumps or changes. Typically, a circular motion with moderate fingertip pressure should be used.   Repeat this exam while lying on your back, again with your arm behind your head and a pillow under your shoulders. Again, use your fingertips to examine both breasts, feeling for lumps and thickening. Begin at 1 o'clock and go clockwise around the whole breast.   At the end of your exam, gently squeeze each nipple to see if there is any drainage. Look for nipple changes, dimpling or redness.   Lastly, examine the upper chest and clavicle areas and in your armpits.  It is not necessary to become alarmed if you find   a breast lump. Most of them are not cancerous. However, it is necessary to see your caregiver if a lump is found in order to have it looked at. Document Released: 04/21/2004 Document Revised: 11/24/2010 Document Reviewed: 07/01/2008 ExitCare Patient Information 2012 [employer]. 

## 2012-11-07 NOTE — Telephone Encounter (Signed)
Ok with me 

## 2012-11-07 NOTE — Telephone Encounter (Signed)
PT is requesting to change PCP from Dr. Tawanna Cooler to Adline Mango. May I schedule this?

## 2012-11-08 ENCOUNTER — Ambulatory Visit: Payer: 59

## 2012-11-08 DIAGNOSIS — R6889 Other general symptoms and signs: Secondary | ICD-10-CM

## 2012-11-08 LAB — T4, FREE: Free T4: 0.93 ng/dL (ref 0.60–1.60)

## 2012-11-09 NOTE — Telephone Encounter (Signed)
Spoke with Dr Tawanna Cooler okay with him

## 2012-11-12 ENCOUNTER — Encounter: Payer: Self-pay | Admitting: *Deleted

## 2012-11-12 ENCOUNTER — Encounter (INDEPENDENT_AMBULATORY_CARE_PROVIDER_SITE_OTHER): Payer: 59

## 2012-11-12 DIAGNOSIS — I4949 Other premature depolarization: Secondary | ICD-10-CM

## 2012-11-12 DIAGNOSIS — I498 Other specified cardiac arrhythmias: Secondary | ICD-10-CM

## 2012-11-12 NOTE — Progress Notes (Signed)
Patient ID: Alison [name], female   DOB: [birth year], 56 y.o.   MRN: [id number] E-Cardio 24 Hour Holter Monitor applied to patient.

## 2012-11-14 NOTE — Telephone Encounter (Signed)
lmovm /ga °

## 2012-11-21 ENCOUNTER — Ambulatory Visit (INDEPENDENT_AMBULATORY_CARE_PROVIDER_SITE_OTHER): Payer: 59 | Admitting: Family

## 2012-11-21 ENCOUNTER — Encounter: Payer: Self-pay | Admitting: Family

## 2012-11-21 VITALS — BP 106/62 | HR 87 | Wt 178.0 lb

## 2012-11-21 DIAGNOSIS — I493 Ventricular premature depolarization: Secondary | ICD-10-CM

## 2012-11-21 DIAGNOSIS — I4949 Other premature depolarization: Secondary | ICD-10-CM

## 2012-11-21 MED ORDER — CLOTRIMAZOLE-BETAMETHASONE 1-0.05 % EX CREA: TOPICAL_CREAM | Freq: Two times a day (BID) | CUTANEOUS | Status: DC

## 2012-11-22 NOTE — Progress Notes (Signed)
Subjective:    Patient ID: Alison [name], female    DOB: [birth year], 57 y.o.   MRN: [id number]  HPI  57 year old [place] American female, nonsmoker is in to discuss her labs. Her TSH was found to be low we added on the T3 and T4 that was normal. She wanted further explanation. She also had an EKG that showed bigeminy. She had a Holter monitor done and the report is unavailable to cardiology at this point. Will discuss further treatment plan if necessary. Reports a moderate to large amount of caffeine daily. Occasionally feels palpitations. Denies any history of any sudden death.  Review of Systems  Constitutional: Negative.   Respiratory: Negative.   Cardiovascular: Negative.   Musculoskeletal: Negative.   Skin: Negative.   Neurological: Negative.   Psychiatric/Behavioral: Negative.    Past Medical History  Diagnosis Date  . MHA (microangiopathic hemolytic anemia)   . Asthma   . Obese   . Fibroadenoma of breast     Right Breast  . Pneumonia 1990  . Bone spur 87    Left Knee  . Heel spur     Right Foot    History   Social History  . Marital Status: Married    Spouse Name: N/A    Number of Children: 1  . Years of Education: N/A   Occupational History  . 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 Smoking status: Current Every Day Smoker -- 1.00 packs/day for 11 years    Types: Cigarettes  . Smokeless tobacco: Not on file  . Alcohol Use: No  . Drug Use: No  . Sexual Activity: Not on file   Other Topics Concern  . Not on file   Social History Narrative   Drinks one cup caffeine daily          Past Surgical History  Procedure Laterality Date  . Vaginal hysterectomy      Fibroids  . Cesarean section      Family History  Problem Relation Age of Onset  . Heart disease Father     Allergies  Allergen Reactions  . Penicillins Other (See Comments)    REACTION: as child    Current Outpatient Prescriptions on File  Prior to Visit  Medication Sig Dispense Refill  . albuterol (PROAIR HFA) 108 (90 BASE) MCG/ACT inhaler Inhale 2 puffs into the lungs every 6 (six) hours as needed for wheezing. 2 puffs every 4 hours as needed only  if your can't catch your breath  1 Inhaler  1  . Ascorbic Acid (VITAMIN C PO) Take 1 tablet by mouth daily.      Marland Kitchen triamterene-hydrochlorothiazide (MAXZIDE-25) 37.5-25 MG per tablet Take 1 tablet by mouth every morning.  30 tablet  5  . vitamin E 400 UNIT capsule Take 400 Units by mouth daily.      Marland Kitchen zolpidem (AMBIEN) 5 MG tablet Take 5 mg by mouth at bedtime as needed. For insomnia       No current facility-administered medications on file prior to visit.    BP 106/62  Pulse 87  Wt 178 lb (80.74 kg)  BMI 28.74 kg/m2chart    Objective:   Physical Exam  Constitutional: She is oriented to person, place, and time. She appears well-developed and well-nourished.  Neck: Normal range of motion. Neck supple. No thyromegaly present.  Cardiovascular: Normal rate, regular rhythm and normal heart sounds.   Pulmonary/Chest: Effort normal and breath sounds normal.  Musculoskeletal: Normal range  of motion.  Neurological: She is alert and oriented to person, place, and time.  Skin: Skin is warm and dry.  Psychiatric: She has a normal mood and affect.          Assessment & Plan:  Assessment: 1. Hyperlipidemia 2.PVCs  Plan: Followup pending the results of the Holter monitor. Decrease caffeine intake.

## 2012-11-23 ENCOUNTER — Ambulatory Visit: Payer: 59 | Admitting: Family

## 2012-12-28 ENCOUNTER — Encounter: Payer: Self-pay | Admitting: Gastroenterology

## 2013-04-09 ENCOUNTER — Telehealth: Payer: Self-pay

## 2013-04-09 NOTE — Telephone Encounter (Signed)
Left message for pt to call back in reference to forms that were dropped off to be completed by PCP

## 2013-04-11 NOTE — Telephone Encounter (Signed)
Pt request that office stamp and signature be put on any page of documents and that they be mailed to her when complete. Forms will be mailed out 04/12/13

## 2013-04-12 ENCOUNTER — Ambulatory Visit: Payer: 59 | Admitting: Family

## 2013-07-03 ENCOUNTER — Telehealth: Payer: Self-pay | Admitting: Internal Medicine

## 2013-07-03 MED ORDER — ALBUTEROL SULFATE HFA 108 (90 BASE) MCG/ACT IN AERS: INHALATION_SPRAY | RESPIRATORY_TRACT | Status: DC

## 2013-07-03 NOTE — Telephone Encounter (Signed)
Pt aware RX has been sent. Nothing further needed 

## 2013-07-05 ENCOUNTER — Ambulatory Visit (INDEPENDENT_AMBULATORY_CARE_PROVIDER_SITE_OTHER): Payer: 59 | Admitting: Pulmonary Disease

## 2013-07-05 ENCOUNTER — Encounter: Payer: Self-pay | Admitting: Pulmonary Disease

## 2013-07-05 VITALS — BP 120/82 | HR 82 | Temp 98.0°F | Ht 65.5 in | Wt 175.0 lb

## 2013-07-05 DIAGNOSIS — J45901 Unspecified asthma with (acute) exacerbation: Secondary | ICD-10-CM | POA: Insufficient documentation

## 2013-07-05 MED ORDER — PREDNISONE 10 MG PO TABS: ORAL_TABLET | ORAL | Status: DC

## 2013-07-05 NOTE — Progress Notes (Signed)
   Subjective:    Patient ID: Alison [name], female    DOB: 01-Sep-1955, 58 y.o.   MRN: [id number]  HPI Patient comes in today for an acute sick visit.  She is normally followed by Dr. Melvyn Novas with asthma, but unfortunately continues to smoke excessively. She gives a one-week history of increasing shortness of breath with wheezing, cough with white mucus production, and some increased chest congestion. She has not had any purulence or fever. She thinks this is being exacerbated by allergies.   Review of Systems  Constitutional: Negative for fever and unexpected weight change.  HENT: Positive for congestion, ear pain and rhinorrhea. Negative for dental problem, nosebleeds, postnasal drip, sinus pressure, sneezing, sore throat and trouble swallowing.   Eyes: Negative for redness and itching.  Respiratory: Positive for cough, chest tightness, shortness of breath and wheezing.   Cardiovascular: Negative for palpitations and leg swelling.  Gastrointestinal: Negative for nausea and vomiting.  Genitourinary: Negative for dysuria.  Musculoskeletal: Negative for joint swelling.  Skin: Negative for rash.  Neurological: Negative for headaches.  Hematological: Does not bruise/bleed easily.  Psychiatric/Behavioral: Negative for dysphoric mood. The patient is not nervous/anxious.        Objective:   Physical Exam Well-developed female in no acute Nose without purulence or discharge noted Oropharynx clear Right TM unable to be seen because of impacted cerumen, left TM normal Neck without lymphadenopathy or thyromegaly Chest with a few rhonchi, no crackles or wheezes. Cardiac exam with regular rate and rhythm Lower extremities without edema, no cyanosis Alert and oriented, moves all 4 extremities.       Assessment & Plan:

## 2013-07-05 NOTE — Assessment & Plan Note (Signed)
The patient presents with what sounds like an acute asthma exacerbation, probably related to worsening airway inflammation from her ongoing smoking. She will need a course of prednisone to get through this, and I have stressed to her the importance of total smoking cessation. The patient wants to blame her breathing issues on everything but her smoking, and I have tried to convince her of the importance of smoking cessation.  She is not producing purulent mucus at this time, and therefore will hold off on antibiotics.

## 2013-07-05 NOTE — Patient Instructions (Signed)
Will treat with a course of prednisone over 8 days. Use your rescue inhaler if needed. You must stop smoking.  This is the key to your breathing gettting better.  followup with Dr. Melvyn Novas in 2 weeks.

## 2013-07-15 ENCOUNTER — Ambulatory Visit (INDEPENDENT_AMBULATORY_CARE_PROVIDER_SITE_OTHER): Payer: 59 | Admitting: Internal Medicine

## 2013-07-15 ENCOUNTER — Ambulatory Visit: Payer: 59 | Admitting: Internal Medicine

## 2013-07-15 ENCOUNTER — Encounter: Payer: Self-pay | Admitting: Internal Medicine

## 2013-07-15 VITALS — BP 106/62 | HR 80 | Temp 98.5°F | Ht 65.5 in | Wt 175.2 lb

## 2013-07-15 DIAGNOSIS — R059 Cough, unspecified: Secondary | ICD-10-CM

## 2013-07-15 DIAGNOSIS — R05 Cough: Secondary | ICD-10-CM

## 2013-07-15 DIAGNOSIS — F17200 Nicotine dependence, unspecified, uncomplicated: Secondary | ICD-10-CM

## 2013-07-15 DIAGNOSIS — J45909 Unspecified asthma, uncomplicated: Secondary | ICD-10-CM

## 2013-07-15 MED ORDER — ALBUTEROL SULFATE HFA 108 (90 BASE) MCG/ACT IN AERS: INHALATION_SPRAY | RESPIRATORY_TRACT | Status: DC

## 2013-07-15 NOTE — Progress Notes (Signed)
Subjective:    Patient ID: Alison [name], female    DOB: 1956/01/21   MRN: [id number]    Brief patient profile:  69 yobf smoker wseen by pulmonary  2010 with intermittent asthma and nl pft's   History of Present Illness  05/02/2012 1st pulmonary ov in EPIC era cc great off all meds until 2 weeks prior to OV  Acute onset ear congestion/ popping then one week prior to OV  Noct wheeze,  Changed to darker mucus 5 days prior to OV.  Severe cough to point of can't get her breath, no better with inhalers. rec Pred/zpak/ no smoke   06/28/2012 f/u ov/Alison [name] Chief Complaint  Patient presents with  . Follow-up    Cough much better, only bothers her first thing in the am, prod with minimal white sputum  rec You do not have bad lung function and you likely never will if you stop smoking now If your breathing worsens or you need to use your rescue inhaler  07/05/13 acute ov/ Alison [name] / cough and HA rec Will treat with a course of prednisone over 8 days. Use your rescue inhaler if needed. You must stop smoking.  This is the key to your breathing gettting better.   07/15/2013 f/u ov/Alison [name] re:  Never took prednisone / mucinex ? Dm helps some/ still smoking  Chief Complaint  Patient presents with  . Follow-up    Pt states still has prod cough- but sputum is now clear. Her chest tightness has resolved. She never started on pred taper.   last saba 3 d prior to [place]    - Not limited by breathing from desired activities     No obvious day to day or daytime variabilty or assoc chronic cough or cp or chest tightness, subjective wheeze overt sinus or hb symptoms. No unusual exp hx or h/o childhood pna/ asthma or knowledge of premature birth.  Sleeping ok without nocturnal  or early am exacerbation  of respiratory  c/o's or need for noct saba. Also denies any obvious fluctuation of symptoms with weather or environmental changes or other aggravating or alleviating factors except as outlined above   Current  Medications, Allergies, Complete Past Medical History, Past Surgical History, Family History, and Social History were reviewed in [employer] record.  ROS  The following are not active complaints unless bolded sore throat, dysphagia, dental problems, itching, sneezing,  nasal congestion or excess/ purulent secretions, ear ache,   fever, chills, sweats, unintended wt loss, pleuritic or exertional cp, hemoptysis,  orthopnea pnd or leg swelling, presyncope, palpitations, heartburn, abdominal pain, anorexia, nausea, vomiting, diarrhea  or change in bowel or urinary habits, change in stools or urine, dysuria,hematuria,  rash, arthralgias, visual complaints, headache, numbness weakness or ataxia or problems with walking or coordination,  change in mood/affect or memory.          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 Nl external ear canals without cough reflex   NECK :  without JVD/Nodes/TM/ nl carotid upstrokes bilaterally   LUNGS: no acc muscle use, clear to A and P bilaterally without cough on insp or exp maneuvers   CV:  RRR  no s3 or murmur or increase in P2, no edema   ABD:  soft and nontender with nl excursion in the supine position. No bruits or organomegaly, bowel sounds nl  MS:  warm without deformities, calf tenderness,  cyanosis or clubbing  SKIN: warm and dry without lesions         CXR  05/02/2012 :   No active lung disease. Linear scarring bilaterally. Healed left upper rib fractures.        Assessment & Plan:

## 2013-07-15 NOTE — Patient Instructions (Addendum)
Only use your albuterol (proair)  as a rescue medication to be used if you can't catch your breath by resting or doing a relaxed purse lip breathing pattern.  - The less you use it, the better it will work when you need it. - Ok to use up to 2 puffs  every 4 hours if you must but call for immediate appointment if use goes up over your usual need - Don't leave home without it !!  (think of it like the spare tire for your car)   The key is to stop smoking completely before smoking completely stops you!    If you are satisfied with your treatment plan let your doctor know and he/she can either refill your medications or you can return here when your prescription runs out.     If in any way you are not 100% satisfied,  please tell us.  If 100% better, tell your friends!

## 2013-07-16 ENCOUNTER — Telehealth: Payer: Self-pay | Admitting: *Deleted

## 2013-07-16 DIAGNOSIS — R05 Cough: Secondary | ICD-10-CM

## 2013-07-16 DIAGNOSIS — R059 Cough, unspecified: Secondary | ICD-10-CM

## 2013-07-16 NOTE — Assessment & Plan Note (Signed)
Most likely related to ongoing smoking, no evidence of acute bronchitis > rec cxr to be complete given smoking hx

## 2013-07-16 NOTE — Telephone Encounter (Signed)
Message copied by Rosana Berger on Tue Jul 16, 2013 12:02 PM ------      Message from: Tanda Rockers      Created: Tue Jul 16, 2013  5:54 AM       I had not realized her cxr was a year old when I I saw her - due to her persistent cough and smoking hx I recommend she obtain one either here or by her primary as part of cpx  ------

## 2013-07-16 NOTE — Assessment & Plan Note (Signed)
I reviewed the Flethcher curve with patient that basically indicates  if you quit smoking when your best day FEV1 is still well preserved (as hers is)  it is highly unlikely you will progress to severe disease and informed the patient there was no medication on the market that has proven to change the curve or the likelihood of progression.  Therefore stopping smoking and maintaining abstinence is the most important aspect of care, not choice of inhalers or for that matter, doctors.

## 2013-07-16 NOTE — Assessment & Plan Note (Signed)
-   PFT's 06/25/12  FEV1 2.22 (90%) ratio 78 with DLCO 70 corrects to 103   Despite smoking hx she does not have copd and asthma well controlled using prns saba per rule of 2's    Each maintenance medication was reviewed in detail including most importantly the difference between maintenance and as needed and under what circumstances the prns are to be used.  Please see instructions for details which were reviewed in writing and the patient given a copy.

## 2013-07-16 NOTE — Telephone Encounter (Signed)
Spoke with the pt and notified of recs per MW She verbalized understanding and order was placed in Epic for cxr  I advised that we will call her once results are read

## 2013-07-19 ENCOUNTER — Ambulatory Visit (INDEPENDENT_AMBULATORY_CARE_PROVIDER_SITE_OTHER)
Admission: RE | Admit: 2013-07-19 | Discharge: 2013-07-19 | Disposition: A | Discharge status: Home / Self Care | Payer: 59 | Source: Ambulatory Visit | Attending: Internal Medicine | Admitting: Internal Medicine

## 2013-07-19 DIAGNOSIS — R05 Cough: Secondary | ICD-10-CM

## 2013-07-19 DIAGNOSIS — R059 Cough, unspecified: Secondary | ICD-10-CM

## 2013-07-22 NOTE — Progress Notes (Signed)
Quick Note:  Spoke with pt and notified of results per Dr. Wert. Pt verbalized understanding and denied any questions.  ______ 

## 2013-09-06 ENCOUNTER — Other Ambulatory Visit: Payer: Self-pay | Admitting: Family Medicine

## 2013-12-02 ENCOUNTER — Other Ambulatory Visit: Payer: Self-pay | Admitting: Family

## 2014-01-01 ENCOUNTER — Other Ambulatory Visit: Payer: Self-pay | Admitting: Family

## 2014-01-07 ENCOUNTER — Ambulatory Visit: Payer: 59 | Admitting: Family

## 2014-01-07 ENCOUNTER — Telehealth: Payer: Self-pay | Admitting: Family

## 2014-01-07 DIAGNOSIS — Z0000 Encounter for general adult medical examination without abnormal findings: Secondary | ICD-10-CM

## 2014-01-07 NOTE — Telephone Encounter (Signed)
Pt has resc fup for a cpe. However. Pt would like to have a vit d level added to her labs in the am. Pt states she discussed w. Abby Potash about having this done at last visit. Is that ok to add?

## 2014-01-07 NOTE — Telephone Encounter (Signed)
Orders placed, including vit d

## 2014-01-08 ENCOUNTER — Other Ambulatory Visit: Payer: Self-pay

## 2014-01-08 ENCOUNTER — Other Ambulatory Visit (INDEPENDENT_AMBULATORY_CARE_PROVIDER_SITE_OTHER): Payer: 59

## 2014-01-08 DIAGNOSIS — R319 Hematuria, unspecified: Secondary | ICD-10-CM

## 2014-01-08 DIAGNOSIS — Z0000 Encounter for general adult medical examination without abnormal findings: Secondary | ICD-10-CM

## 2014-01-08 LAB — POCT URINALYSIS DIPSTICK
Bilirubin, UA: NEGATIVE
Glucose, UA: NEGATIVE
Ketones, UA: NEGATIVE
Leukocytes, UA: NEGATIVE
NITRITE UA: NEGATIVE
SPEC GRAV UA: 1.02
UROBILINOGEN UA: 0.2
pH, UA: 7

## 2014-01-08 LAB — BASIC METABOLIC PANEL
BUN: 12 mg/dL (ref 6–23)
CALCIUM: 9.1 mg/dL (ref 8.4–10.5)
CO2: 32 mEq/L (ref 19–32)
Chloride: 105 mEq/L (ref 96–112)
Creatinine, Ser: 0.8 mg/dL (ref 0.4–1.2)
GFR: 96.54 mL/min (ref >60.00)
GLUCOSE: 96 mg/dL (ref 70–99)
Potassium: 4.2 mEq/L (ref 3.5–5.1)
Sodium: 142 mEq/L (ref 135–145)

## 2014-01-08 LAB — HEPATIC FUNCTION PANEL
ALT: 29 U/L (ref 0–35)
AST: 26 U/L (ref 0–37)
Albumin: 3.4 g/dL — ABNORMAL LOW (ref 3.5–5.2)
Alkaline Phosphatase: 47 U/L (ref 39–117)
BILIRUBIN TOTAL: 0.7 mg/dL (ref 0.2–1.2)
Bilirubin, Direct: 0.1 mg/dL (ref 0.0–0.3)
Total Protein: 7.1 g/dL (ref 6.0–8.3)

## 2014-01-08 LAB — CBC WITH DIFFERENTIAL/PLATELET
Basophils Absolute: 0 10*3/uL (ref 0.0–0.1)
Basophils Relative: 0.5 % (ref 0.0–3.0)
Eosinophils Absolute: 0.2 10*3/uL (ref 0.0–0.7)
Eosinophils Relative: 3.3 % (ref 0.0–5.0)
HEMATOCRIT: 36.8 % (ref 36.0–46.0)
HEMOGLOBIN: 11.8 g/dL — AB (ref 12.0–15.0)
LYMPHS ABS: 2.9 10*3/uL (ref 0.7–4.0)
Lymphocytes Relative: 44.4 % (ref 12.0–46.0)
MCHC: 32 g/dL (ref 30.0–36.0)
MCV: 88.9 fl (ref 78.0–100.0)
MONOS PCT: 8.1 % (ref 3.0–12.0)
Monocytes Absolute: 0.5 10*3/uL (ref 0.1–1.0)
NEUTROS ABS: 2.8 10*3/uL (ref 1.4–7.7)
Neutrophils Relative %: 43.7 % (ref 43.0–77.0)
PLATELETS: 259 10*3/uL (ref 150.0–400.0)
RBC: 4.15 Mil/uL (ref 3.87–5.11)
RDW: 13.6 % (ref 11.5–15.5)
WBC: 6.5 10*3/uL (ref 4.0–10.5)

## 2014-01-08 LAB — LIPID PANEL
CHOLESTEROL: 176 mg/dL (ref 0–200)
HDL: 36 mg/dL — ABNORMAL LOW (ref >39.00)
LDL Cholesterol: 110 mg/dL — ABNORMAL HIGH (ref 0–99)
NonHDL: 140
Total CHOL/HDL Ratio: 5
Triglycerides: 151 mg/dL — ABNORMAL HIGH (ref 0.0–149.0)
VLDL: 30.2 mg/dL (ref 0.0–40.0)

## 2014-01-08 LAB — TSH: TSH: 0.38 u[IU]/mL (ref 0.35–4.50)

## 2014-01-08 LAB — VITAMIN D 25 HYDROXY (VIT D DEFICIENCY, FRACTURES): VITD: 14.35 ng/mL — AB (ref 30.00–100.00)

## 2014-01-08 MED ORDER — TRIAMTERENE-HCTZ 37.5-25 MG PO TABS: ORAL_TABLET | ORAL | Status: DC

## 2014-01-08 NOTE — Telephone Encounter (Signed)
30 day supply sent to pharmacy.  

## 2014-01-10 LAB — URINE CULTURE: Colony Count: >=100000

## 2014-01-21 ENCOUNTER — Ambulatory Visit (INDEPENDENT_AMBULATORY_CARE_PROVIDER_SITE_OTHER): Payer: 59 | Admitting: Family

## 2014-01-21 ENCOUNTER — Other Ambulatory Visit: Payer: Self-pay | Admitting: Family

## 2014-01-21 ENCOUNTER — Encounter: Payer: Self-pay | Admitting: Family

## 2014-01-21 VITALS — BP 92/70 | HR 99 | Ht 65.0 in | Wt 163.0 lb

## 2014-01-21 DIAGNOSIS — E785 Hyperlipidemia, unspecified: Secondary | ICD-10-CM

## 2014-01-21 DIAGNOSIS — I10 Essential (primary) hypertension: Secondary | ICD-10-CM

## 2014-01-21 DIAGNOSIS — E559 Vitamin D deficiency, unspecified: Secondary | ICD-10-CM

## 2014-01-21 DIAGNOSIS — G4700 Insomnia, unspecified: Secondary | ICD-10-CM

## 2014-01-21 DIAGNOSIS — Z0000 Encounter for general adult medical examination without abnormal findings: Secondary | ICD-10-CM

## 2014-01-21 LAB — POCT URINALYSIS DIPSTICK
Bilirubin, UA: NEGATIVE
Blood, UA: NEGATIVE
Glucose, UA: NEGATIVE
KETONES UA: NEGATIVE
Nitrite, UA: NEGATIVE
Protein, UA: NEGATIVE
Spec Grav, UA: 1.015
Urobilinogen, UA: 0.2
pH, UA: 7

## 2014-01-21 MED ORDER — VITAMIN D (ERGOCALCIFEROL) 1.25 MG (50000 UNIT) PO CAPS: 50000.0000 [IU] | ORAL_CAPSULE | ORAL | Status: DC

## 2014-01-21 NOTE — Progress Notes (Signed)
Subjective:    Patient ID: Alison Alison [name], female    DOB: Oct 15, 1955, 58 y.o.   MRN: [id number]  HPI 58 year old Alison [name], smoker is in today for CPX. Was found to have low vitamin D on labs and elevated LDL. She has decided to join [employer] to work on cardiovascular exercise. Has a weakeness for sweets. Reports negative mammogram thus year. Believes she may be due for colonoscopy. Prefers a female provider. Declines flu shot.    Review of Systems  Constitutional: Negative.   HENT: Negative.   Eyes: Negative.   Respiratory: Negative.   Cardiovascular: Negative.   Gastrointestinal: Negative.   Endocrine: Negative.   Genitourinary: Negative.   Musculoskeletal: Negative.   Skin: Negative.   Allergic/Immunologic: Negative.   Neurological: Negative.   Hematological: Negative.   Psychiatric/Behavioral: Negative.    Past Medical History  Diagnosis Date  . MHA (microangiopathic hemolytic anemia)   . Asthma   . Obese   . Fibroadenoma of breast     Right Breast  . Pneumonia 1990  . Bone spur 87    Left Knee  . Heel spur     Right Foot    History   Social History  . Marital Status: Married    Spouse Name: N/A    Number of Children: 1  . Years of Education: N/A   Occupational History  . 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 Smoking status: Current Every Day Smoker -- 1.00 packs/day for 11 years    Types: Cigarettes  . Smokeless tobacco: Not on file  . Alcohol Use: No  . Drug Use: No  . Sexual Activity: Not on file   Other Topics Concern  . Not on file   Social History Narrative   Drinks one cup caffeine daily          Past Surgical History  Procedure Laterality Date  . Vaginal hysterectomy      Fibroids  . Cesarean section      Family History  Problem Relation Age of Onset  . Heart disease Father     Allergies  Allergen Reactions  . Penicillins Other (See Comments)    REACTION: as child    Current  Outpatient Prescriptions on File Prior to Visit  Medication Sig Dispense Refill  . albuterol (PROAIR HFA) 108 (90 BASE) MCG/ACT inhaler 2 puffs every 4 hours as needed only  if your can't catch your breath  1 Inhaler  0  . Ascorbic Acid (VITAMIN C PO) Take 1 tablet by mouth daily.      Marland Kitchen triamterene-hydrochlorothiazide (MAXZIDE-25) 37.5-25 MG per tablet take 1 tablet by mouth every morning  30 tablet  0  . vitamin E 400 UNIT capsule Take 400 Units by mouth daily.      Marland Kitchen zolpidem (AMBIEN) 5 MG tablet Take 5 mg by mouth at bedtime as needed. For insomnia       No current facility-administered medications on file prior to visit.    BP 92/70  Pulse 99  Ht 5\' 5"  (1.651 m)  Wt 163 lb (73.936 kg)  BMI Alison.12 kg/m2chart    Objective:   Physical Exam  Constitutional: She is oriented to person, place, and time. She appears well-developed and well-nourished.  HENT:  Right Ear: External ear normal.  Left Ear: External ear normal.  Nose: Nose normal.  Mouth/Throat: Oropharynx is clear and moist.  Eyes: Conjunctivae and EOM are normal. Pupils are equal,  round, and reactive to light.  Neck: Normal range of motion. Neck supple.  Cardiovascular: Normal rate, regular rhythm and normal heart sounds.   Pulmonary/Chest: Effort normal and breath sounds normal.  Abdominal: Soft. Bowel sounds are normal.  Musculoskeletal: Normal range of motion.  Neurological: She is alert and oriented to person, place, and time. She has normal reflexes.  Skin: Skin is warm and dry.  Psychiatric: She has a normal mood and affect.          Assessment & Plan:  Zari was seen today for annual exam.  Diagnoses and associated orders for this visit:  Preventative health care - POCT urinalysis dipstick  Essential hypertension - POCT urinalysis dipstick  Insomnia  Vitamin D deficiency - Vitamin D, 1,25-dihydroxy; Future  Hyperlipidemia  Other Orders - Vitamin D, Ergocalciferol, (DRISDOL) 50000 UNITS CAPS  capsule; Take 1 capsule (50,000 Units total) by mouth every 7 (seven) days.    Call the office with any questions or concerns. Start vitamin D. Recheck lab only in 2 months. Exercise 3-4 times a week. Continue [employer].

## 2014-01-21 NOTE — Patient Instructions (Signed)
Exercise to Stay Healthy Exercise helps you become and stay healthy. EXERCISE IDEAS AND TIPS Choose exercises that:  You enjoy.  Fit into your day. You do not need to exercise really hard to be healthy. You can do exercises at a slow or medium level and stay healthy. You can:  Stretch before and after working out.  Try yoga, Pilates, or tai chi.  Lift weights.  Walk fast, swim, jog, run, climb stairs, bicycle, dance, or rollerskate.  Take aerobic classes. Exercises that burn about 150 calories:  Running 1  miles in 15 minutes.  Playing volleyball for 45 to 60 minutes.  Washing and waxing a car for 45 to 60 minutes.  Playing touch football for 45 minutes.  Walking 1  miles in 35 minutes.  Pushing a stroller 1  miles in 30 minutes.  Playing basketball for 30 minutes.  Raking leaves for 30 minutes.  Bicycling 5 miles in 30 minutes.  Walking 2 miles in 30 minutes.  Dancing for 30 minutes.  Shoveling snow for 15 minutes.  Swimming laps for 20 minutes.  Walking up stairs for 15 minutes.  Bicycling 4 miles in 15 minutes.  Gardening for 30 to 45 minutes.  Jumping rope for 15 minutes.  Washing windows or floors for 45 to 60 minutes. Document Released: 04/16/2010 Document Revised: 06/06/2011 Document Reviewed: 04/16/2010 ExitCare Patient Information 2015 [employer]. This information is not intended to replace advice given to you by your health care provider. Make sure you discuss any questions you have with your health care provider.  

## 2014-01-21 NOTE — Progress Notes (Signed)
Pre visit review using our clinic review tool, if applicable. No additional management support is needed unless otherwise documented below in the visit note. 

## 2014-01-22 ENCOUNTER — Telehealth: Payer: Self-pay | Admitting: Family

## 2014-01-22 NOTE — Telephone Encounter (Signed)
emmi mailed  °

## 2014-01-31 ENCOUNTER — Encounter: Payer: Self-pay | Admitting: Gastroenterology

## 2014-02-05 ENCOUNTER — Other Ambulatory Visit: Payer: Self-pay | Admitting: Family

## 2014-03-17 ENCOUNTER — Other Ambulatory Visit (INDEPENDENT_AMBULATORY_CARE_PROVIDER_SITE_OTHER): Payer: 59

## 2014-03-17 DIAGNOSIS — E559 Vitamin D deficiency, unspecified: Secondary | ICD-10-CM

## 2014-03-19 LAB — VITAMIN D 1,25 DIHYDROXY
Vitamin D 1, 25 (OH)2 Total: 41 pg/mL (ref 18–72)
Vitamin D2 1, 25 (OH)2: 29 pg/mL
Vitamin D3 1, 25 (OH)2: 12 pg/mL

## 2014-04-11 ENCOUNTER — Encounter: Payer: 59 | Admitting: Gastroenterology

## 2014-08-06 ENCOUNTER — Other Ambulatory Visit: Payer: Self-pay | Admitting: Family

## 2014-08-07 ENCOUNTER — Encounter: Payer: Self-pay | Admitting: *Deleted

## 2014-08-08 ENCOUNTER — Telehealth: Payer: Self-pay

## 2014-08-08 NOTE — Telephone Encounter (Signed)
Left message with pt's husband. He says he will text her to call back.

## 2014-08-22 ENCOUNTER — Encounter: Payer: Self-pay | Admitting: Family Medicine

## 2014-08-22 LAB — HM COLONOSCOPY

## 2014-09-15 ENCOUNTER — Ambulatory Visit (INDEPENDENT_AMBULATORY_CARE_PROVIDER_SITE_OTHER): Payer: 59 | Admitting: Family Medicine

## 2014-09-15 ENCOUNTER — Encounter: Payer: Self-pay | Admitting: Family Medicine

## 2014-09-15 VITALS — BP 120/72 | HR 74 | Temp 98.6°F | Resp 18 | Ht 65.0 in | Wt 179.0 lb

## 2014-09-15 DIAGNOSIS — K635 Polyp of colon: Secondary | ICD-10-CM | POA: Insufficient documentation

## 2014-09-15 DIAGNOSIS — D179 Benign lipomatous neoplasm, unspecified: Secondary | ICD-10-CM | POA: Diagnosis not present

## 2014-09-15 DIAGNOSIS — B353 Tinea pedis: Secondary | ICD-10-CM

## 2014-09-15 MED ORDER — TRIAMTERENE-HCTZ 37.5-25 MG PO TABS: 1.0000 | ORAL_TABLET | Freq: Every morning | ORAL | Status: DC

## 2014-09-15 MED ORDER — CLOTRIMAZOLE 1 % EX CREA: 1.0000 "application " | TOPICAL_CREAM | Freq: Two times a day (BID) | CUTANEOUS | Status: DC

## 2014-09-15 MED ORDER — VITAMIN D 1000 UNITS PO TABS: 1000.0000 [IU] | ORAL_TABLET | Freq: Every day | ORAL | Status: DC

## 2014-09-15 NOTE — Progress Notes (Signed)
Subjective:    Patient ID: Alison [name], female    DOB: Jul 30, 1955, 59 y.o.   MRN: [id number]  HPI Patient is a very pleasant 59 y/o AAF here to establish care.  She has a large 6 cm lipoma on anterior right shoulder which she is requesting excision and referral to general surgery.  She also has a history of recurrent fungal infections on the plantar aspect of her left foot. She has onychomycosis in her left great toenail. She has a scaly hyperpigmented rash on the plantar aspect of her feet and in between her toes. She has taken Lotrimin cream for which is temporarily helped clear the infection.  However it will occur usually a few weeks after she stops the medication. Her mammogram is up-to-date. Her colonoscopy was just performed in May and is up-to-date. She no longer requires Pap smear because of a hysterectomy. Unfortunately she continues to smoke. She also has a history of hypertension although her blood pressure is well controlled today. She is not due for fasting lab work or physical exam until November 201.pmh Past Surgical History  Procedure Laterality Date  . Vaginal hysterectomy      Fibroids  . Cesarean section     Current Outpatient Prescriptions on File Prior to Visit  Medication Sig Dispense Refill  . albuterol (PROAIR HFA) 108 (90 BASE) MCG/ACT inhaler 2 puffs every 4 hours as needed only  if your can't catch your breath 1 Inhaler 0  . Ascorbic Acid (VITAMIN C PO) Take 1 tablet by mouth daily.    . vitamin E 400 UNIT capsule Take 400 Units by mouth daily.    Marland Kitchen zolpidem (AMBIEN) 5 MG tablet Take 5 mg by mouth at bedtime as needed. For insomnia     No current facility-administered medications on file prior to visit.   Allergies  Allergen Reactions  . Penicillins Other (See Comments)    REACTION: as child   History   Social History  . Marital Status: Married    Spouse Name: N/A  . Number of Children: 1  . Years of Education: N/A   Occupational History  .  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 Smoking status: Current Every Day Smoker -- 1.00 packs/day for 11 years    Types: Cigarettes  . Smokeless tobacco: Never Used  . Alcohol Use: No  . Drug Use: No  . Sexual Activity: Yes    Birth Control/ Protection: None   Other Topics Concern  . Not on file   Social History Narrative   Drinks one cup caffeine daily         Family History  Problem Relation Age of Onset  . Heart disease Father     cardiomyopathy (HTN)  . Hypertension Father   . Hypertension Mother   . Hypertension Sister   . Hypertension Brother       Review of Systems  All other systems reviewed and are negative.      Objective:   Physical Exam  Constitutional: She is oriented to person, place, and time. She appears well-developed and well-nourished. No distress.  HENT:  Mouth/Throat: Oropharynx is clear and moist.  Eyes: Conjunctivae are normal.  Neck: Neck supple. No thyromegaly present.  Cardiovascular: Normal rate, regular rhythm, normal heart sounds and intact distal pulses.  Exam reveals no gallop and no friction rub.   No murmur heard. Pulmonary/Chest: Effort normal and breath sounds normal. No respiratory distress. She has no wheezes.  She has no rales. She exhibits no tenderness.  Abdominal: Soft. Bowel sounds are normal. She exhibits no distension and no mass. There is no tenderness. There is no rebound and no guarding.  Musculoskeletal: Normal range of motion. She exhibits no edema or tenderness.  Lymphadenopathy:    She has no cervical adenopathy.  Neurological: She is alert and oriented to person, place, and time. She has normal reflexes. She displays normal reflexes. No cranial nerve deficit. She exhibits normal muscle tone. Coordination normal.  Skin: Rash noted. She is not diaphoretic.  Vitals reviewed.         Assessment & Plan:  Tinea pedis of left foot - Plan: clotrimazole (LOTRIMIN) 1 %  cream  Lipoma - Plan: Ambulatory referral to General Surgery  Patient can treat athlete's foot on her left foot with Lotrimin cream applied twice a day for 10-14 days. However I suspect that she will have recurrent infections as long as she has onychomycosis in her left great toenail. I have given her the option of Lamisil 250 mg by mouth daily for 3 months to eradicate the infection entirely however she hesitates to do this because she does not want to return to the lab work to monitor her liver function test. Therefore we discussed ways to help sterilize her foot wear to prevent reinfection. I will refer the patient to a general surgeon for excision of a lipoma on her right shoulder. Her blood pressure is excellent. I will like to see the patient back for complete physical exam in November

## 2014-09-30 ENCOUNTER — Ambulatory Visit: Payer: Self-pay | Admitting: Surgery

## 2014-10-28 ENCOUNTER — Ambulatory Visit (INDEPENDENT_AMBULATORY_CARE_PROVIDER_SITE_OTHER): Payer: 59 | Admitting: Podiatry

## 2014-10-28 ENCOUNTER — Encounter: Payer: Self-pay | Admitting: Podiatry

## 2014-10-28 VITALS — BP 131/78 | HR 65 | Ht 65.0 in | Wt 179.0 lb

## 2014-10-28 DIAGNOSIS — M2010 Hallux valgus (acquired), unspecified foot: Secondary | ICD-10-CM | POA: Diagnosis not present

## 2014-10-28 DIAGNOSIS — M21619 Bunion of unspecified foot: Secondary | ICD-10-CM | POA: Insufficient documentation

## 2014-10-28 DIAGNOSIS — M79604 Pain in right leg: Secondary | ICD-10-CM

## 2014-10-28 DIAGNOSIS — M79673 Pain in unspecified foot: Secondary | ICD-10-CM

## 2014-10-28 DIAGNOSIS — M21611 Bunion of right foot: Secondary | ICD-10-CM

## 2014-10-28 DIAGNOSIS — M79606 Pain in leg, unspecified: Secondary | ICD-10-CM | POA: Insufficient documentation

## 2014-10-28 NOTE — Progress Notes (Signed)
Subjective: 59 year old female presents complaining of pain in right foot. Right foot bunion is having consistent sharp pain. Used to hurt only on certain shoes, now it hurts all the time. Patient is seeking surgical intervention.  Objective: Neurovascular status are within normal. Large bunion on right foot that is painful. Normal range of motion.  Radiographic examination reveal increased Hallux valgus and first intermetatarsal angles with bunion formation R>L. All other findings are within normal.   Assessment: Painful bunion right.   Plan: Reviewed findings and consent form reviewed for [facility] bunionectomy right foot.

## 2014-11-07 LAB — HM MAMMOGRAPHY

## 2014-11-10 ENCOUNTER — Encounter: Payer: Self-pay | Admitting: Family Medicine

## 2014-11-17 ENCOUNTER — Other Ambulatory Visit: Payer: Self-pay | Admitting: Family Medicine

## 2014-11-17 DIAGNOSIS — I10 Essential (primary) hypertension: Secondary | ICD-10-CM

## 2014-11-21 ENCOUNTER — Ambulatory Visit (HOSPITAL_BASED_OUTPATIENT_CLINIC_OR_DEPARTMENT_OTHER): Admit: 2014-11-21 | Payer: Self-pay | Admitting: Surgery

## 2014-11-21 ENCOUNTER — Encounter (HOSPITAL_BASED_OUTPATIENT_CLINIC_OR_DEPARTMENT_OTHER): Payer: Self-pay

## 2014-11-21 SURGERY — EXCISION MASS
Anesthesia: General | Laterality: Right

## 2014-11-25 ENCOUNTER — Encounter: Payer: Self-pay | Admitting: Podiatry

## 2014-11-26 ENCOUNTER — Other Ambulatory Visit: Payer: 59

## 2014-11-26 DIAGNOSIS — I10 Essential (primary) hypertension: Secondary | ICD-10-CM

## 2014-11-26 LAB — COMPLETE METABOLIC PANEL WITH GFR
ALK PHOS: 45 U/L (ref 33–130)
ALT: 15 U/L (ref 6–29)
AST: 14 U/L (ref 10–35)
Albumin: 4.5 g/dL (ref 3.6–5.1)
BUN: 17 mg/dL (ref 7–25)
CO2: 25 mmol/L (ref 20–31)
Calcium: 9.6 mg/dL (ref 8.6–10.4)
Chloride: 103 mmol/L (ref 98–110)
Creat: 0.76 mg/dL (ref 0.50–1.05)
GFR, Est African American: >89 mL/min (ref >=60)
GFR, Est Non African American: 87 mL/min (ref >=60)
GLUCOSE: 93 mg/dL (ref 70–99)
POTASSIUM: 3.8 mmol/L (ref 3.5–5.3)
SODIUM: 140 mmol/L (ref 135–146)
Total Bilirubin: 0.4 mg/dL (ref 0.2–1.2)
Total Protein: 7.2 g/dL (ref 6.1–8.1)

## 2014-11-26 LAB — CBC WITH DIFFERENTIAL/PLATELET
BASOS PCT: 1 % (ref 0–1)
Basophils Absolute: 0.1 10*3/uL (ref 0.0–0.1)
Eosinophils Absolute: 0.2 10*3/uL (ref 0.0–0.7)
Eosinophils Relative: 4 % (ref 0–5)
HCT: 40.1 % (ref 36.0–46.0)
Hemoglobin: 13.1 g/dL (ref 12.0–15.0)
Lymphocytes Relative: 45 % (ref 12–46)
Lymphs Abs: 2.7 10*3/uL (ref 0.7–4.0)
MCH: 28.2 pg (ref 26.0–34.0)
MCHC: 32.7 g/dL (ref 30.0–36.0)
MCV: 86.4 fL (ref 78.0–100.0)
MONO ABS: 0.5 10*3/uL (ref 0.1–1.0)
MPV: 9.9 fL (ref 8.6–12.4)
Monocytes Relative: 8 % (ref 3–12)
NEUTROS ABS: 2.5 10*3/uL (ref 1.7–7.7)
NEUTROS PCT: 42 % — AB (ref 43–77)
Platelets: 297 10*3/uL (ref 150–400)
RBC: 4.64 MIL/uL (ref 3.87–5.11)
RDW: 13.6 % (ref 11.5–15.5)
WBC: 6 10*3/uL (ref 4.0–10.5)

## 2014-11-26 LAB — LIPID PANEL
CHOL/HDL RATIO: 4.3 ratio (ref <=5.0)
Cholesterol: 203 mg/dL — ABNORMAL HIGH (ref 125–200)
HDL: 47 mg/dL (ref >=46)
LDL CALC: 132 mg/dL — AB (ref <130)
Triglycerides: 121 mg/dL (ref <150)
VLDL: 24 mg/dL (ref <30)

## 2014-11-27 ENCOUNTER — Encounter: Payer: Self-pay | Admitting: Family Medicine

## 2014-11-28 ENCOUNTER — Ambulatory Visit: Payer: Self-pay | Admitting: Podiatry

## 2014-12-02 ENCOUNTER — Encounter: Payer: Self-pay | Admitting: Podiatry

## 2014-12-03 ENCOUNTER — Other Ambulatory Visit: Payer: Self-pay

## 2014-12-04 DIAGNOSIS — M2010 Hallux valgus (acquired), unspecified foot: Secondary | ICD-10-CM | POA: Diagnosis not present

## 2014-12-04 HISTORY — PX: BUNIONECTOMY: SHX129

## 2014-12-09 ENCOUNTER — Encounter: Payer: Self-pay | Admitting: Podiatry

## 2014-12-09 ENCOUNTER — Ambulatory Visit (INDEPENDENT_AMBULATORY_CARE_PROVIDER_SITE_OTHER): Payer: 59 | Admitting: Podiatry

## 2014-12-09 VITALS — BP 139/92 | HR 77

## 2014-12-09 DIAGNOSIS — M2011 Hallux valgus (acquired), right foot: Secondary | ICD-10-CM

## 2014-12-09 DIAGNOSIS — M2010 Hallux valgus (acquired), unspecified foot: Secondary | ICD-10-CM

## 2014-12-09 DIAGNOSIS — M21611 Bunion of right foot: Secondary | ICD-10-CM

## 2014-12-09 DIAGNOSIS — M21612 Bunion of left foot: Secondary | ICD-10-CM

## 2014-12-09 NOTE — Patient Instructions (Signed)
Post op wound doing well. Continue stay in surgical shoe with light ambulation. Return in one week.

## 2014-12-09 NOTE — Progress Notes (Signed)
5 days post op wound following [place] bunionectomy right foot.  No complaints.  No edema or erythema noted. Good range of motion at the first MPJ right foot.  Post op x-ray lined up good without disturbance in fixation.  Dressing changed. Return in one week.

## 2014-12-11 ENCOUNTER — Telehealth: Payer: Self-pay | Admitting: *Deleted

## 2014-12-11 NOTE — Telephone Encounter (Signed)
12/11/2014 Patient called this am and ask should she continue taking her anti inflamotory, she is taking it every 12 hours, I told her yes unless it was making her sick and she said it wasn't

## 2014-12-16 ENCOUNTER — Encounter: Payer: Self-pay | Admitting: Podiatry

## 2014-12-16 ENCOUNTER — Ambulatory Visit (INDEPENDENT_AMBULATORY_CARE_PROVIDER_SITE_OTHER): Payer: 59 | Admitting: Podiatry

## 2014-12-16 VITALS — BP 146/89 | HR 67

## 2014-12-16 DIAGNOSIS — Z9889 Other specified postprocedural states: Secondary | ICD-10-CM

## 2014-12-16 NOTE — Progress Notes (Signed)
12 days post op. No complaints. No fever, edema or erythema noted.  Well coapted surgical wound with reduced deformity right first MPJ. Suture removed. Continue to wear surgical shoes and use Ace bandage. Home care instruction given.

## 2014-12-16 NOTE — Patient Instructions (Addendum)
12 days post op wound healing well. Sutures  removed. Ok to get wet. Stay in surgical shoes. Return in 2 weeks.

## 2014-12-30 ENCOUNTER — Encounter: Payer: Self-pay | Admitting: Podiatry

## 2014-12-30 ENCOUNTER — Ambulatory Visit (INDEPENDENT_AMBULATORY_CARE_PROVIDER_SITE_OTHER): Payer: 59 | Admitting: Podiatry

## 2014-12-30 DIAGNOSIS — M79673 Pain in unspecified foot: Secondary | ICD-10-CM

## 2014-12-30 DIAGNOSIS — Z9889 Other specified postprocedural states: Secondary | ICD-10-CM

## 2014-12-30 NOTE — Patient Instructions (Signed)
4 weeks post op. Doing well. Use compression stockinet and may use regular tennis shoes. Return in 3 weeks.

## 2014-12-30 NOTE — Progress Notes (Signed)
Status post right bunionectomy 12/04/14.  Doing well. Good range of motion at the first MPJ without edema or erythema. Able to ambulate without difficulty. Ok to try regular shoes. Continue with compression stockinet.  Return in 3 weeks.

## 2015-01-16 ENCOUNTER — Other Ambulatory Visit: Payer: 59

## 2015-01-16 DIAGNOSIS — I10 Essential (primary) hypertension: Secondary | ICD-10-CM

## 2015-01-16 DIAGNOSIS — Z0000 Encounter for general adult medical examination without abnormal findings: Secondary | ICD-10-CM

## 2015-01-16 DIAGNOSIS — E559 Vitamin D deficiency, unspecified: Secondary | ICD-10-CM

## 2015-01-16 DIAGNOSIS — Z79899 Other long term (current) drug therapy: Secondary | ICD-10-CM

## 2015-01-16 LAB — COMPLETE METABOLIC PANEL WITH GFR
ALBUMIN: 4.3 g/dL (ref 3.6–5.1)
ALK PHOS: 42 U/L (ref 33–130)
ALT: 14 U/L (ref 6–29)
AST: 14 U/L (ref 10–35)
BILIRUBIN TOTAL: 0.6 mg/dL (ref 0.2–1.2)
BUN: 18 mg/dL (ref 7–25)
CO2: 29 mmol/L (ref 20–31)
Calcium: 9.7 mg/dL (ref 8.6–10.4)
Chloride: 100 mmol/L (ref 98–110)
Creat: 0.72 mg/dL (ref 0.50–1.05)
GFR, Est African American: >89 mL/min (ref >=60)
GLUCOSE: 86 mg/dL (ref 70–99)
POTASSIUM: 3.7 mmol/L (ref 3.5–5.3)
SODIUM: 140 mmol/L (ref 135–146)
TOTAL PROTEIN: 7.6 g/dL (ref 6.1–8.1)

## 2015-01-16 LAB — LIPID PANEL
Cholesterol: 216 mg/dL — ABNORMAL HIGH (ref 125–200)
HDL: 41 mg/dL — AB (ref >=46)
LDL CALC: 151 mg/dL — AB (ref <130)
TRIGLYCERIDES: 122 mg/dL (ref <150)
Total CHOL/HDL Ratio: 5.3 Ratio — ABNORMAL HIGH (ref <=5.0)
VLDL: 24 mg/dL (ref <30)

## 2015-01-16 LAB — CBC WITH DIFFERENTIAL/PLATELET
BASOS PCT: 0 % (ref 0–1)
Basophils Absolute: 0 10*3/uL (ref 0.0–0.1)
EOS ABS: 0.1 10*3/uL (ref 0.0–0.7)
Eosinophils Relative: 1 % (ref 0–5)
HCT: 36.3 % (ref 36.0–46.0)
Hemoglobin: 12.6 g/dL (ref 12.0–15.0)
Lymphocytes Relative: 44 % (ref 12–46)
Lymphs Abs: 3.1 10*3/uL (ref 0.7–4.0)
MCH: 28.4 pg (ref 26.0–34.0)
MCHC: 34.7 g/dL (ref 30.0–36.0)
MCV: 81.9 fL (ref 78.0–100.0)
MONO ABS: 0.4 10*3/uL (ref 0.1–1.0)
MONOS PCT: 6 % (ref 3–12)
MPV: 10 fL (ref 8.6–12.4)
NEUTROS ABS: 3.5 10*3/uL (ref 1.7–7.7)
Neutrophils Relative %: 49 % (ref 43–77)
PLATELETS: 303 10*3/uL (ref 150–400)
RBC: 4.43 MIL/uL (ref 3.87–5.11)
RDW: 13.4 % (ref 11.5–15.5)
WBC: 7.1 10*3/uL (ref 4.0–10.5)

## 2015-01-16 LAB — TSH: TSH: 0.161 u[IU]/mL — ABNORMAL LOW (ref 0.350–4.500)

## 2015-01-17 LAB — VITAMIN D 25 HYDROXY (VIT D DEFICIENCY, FRACTURES): VIT D 25 HYDROXY: 37 ng/mL (ref 30–100)

## 2015-01-20 ENCOUNTER — Encounter: Payer: Self-pay | Admitting: Podiatry

## 2015-01-20 ENCOUNTER — Ambulatory Visit (INDEPENDENT_AMBULATORY_CARE_PROVIDER_SITE_OTHER): Payer: 59 | Admitting: Podiatry

## 2015-01-20 DIAGNOSIS — Z9889 Other specified postprocedural states: Secondary | ICD-10-CM

## 2015-01-20 NOTE — Progress Notes (Signed)
7 weeks post op right foot bunionectomy (12/04/14). No complaints. Continue using compression stockinet in tennis shoes and increase activity as tolerated. Return in 3 weeks.

## 2015-01-20 NOTE — Patient Instructions (Signed)
7 weeks post op right bunionectomy. Doing well. May continue using compression stockinet.  Increase activity as tolerated.  Return in 3 weeks.

## 2015-01-26 ENCOUNTER — Encounter: Payer: Self-pay | Admitting: Family Medicine

## 2015-01-26 ENCOUNTER — Ambulatory Visit (INDEPENDENT_AMBULATORY_CARE_PROVIDER_SITE_OTHER): Payer: 59 | Admitting: Family Medicine

## 2015-01-26 VITALS — BP 118/74 | HR 82 | Temp 99.0°F | Resp 16 | Ht 65.5 in | Wt 177.0 lb

## 2015-01-26 DIAGNOSIS — R946 Abnormal results of thyroid function studies: Secondary | ICD-10-CM

## 2015-01-26 DIAGNOSIS — I10 Essential (primary) hypertension: Secondary | ICD-10-CM | POA: Diagnosis not present

## 2015-01-26 DIAGNOSIS — E785 Hyperlipidemia, unspecified: Secondary | ICD-10-CM | POA: Diagnosis not present

## 2015-01-26 DIAGNOSIS — Z0000 Encounter for general adult medical examination without abnormal findings: Secondary | ICD-10-CM

## 2015-01-26 DIAGNOSIS — R7989 Other specified abnormal findings of blood chemistry: Secondary | ICD-10-CM

## 2015-01-26 MED ORDER — ATORVASTATIN CALCIUM 20 MG PO TABS: 20.0000 mg | ORAL_TABLET | Freq: Every day | ORAL | Status: DC

## 2015-01-26 NOTE — Progress Notes (Signed)
Subjective:    Patient ID: Alison [name], female    DOB: 1955-11-05, 59 y.o.   MRN: [id number]  HPI  The patient is a very pleasant 59 year old African-American female who is here today for complete physical exam. Past medical history is significant for tobacco abuse, hypertension, and a family history of premature cardiovascular disease in her father. Mammogram was performed earlier this year and was normal. She recently had a Pap smear at her gynecologist and was normal. Her last colonoscopy was performed in May and is not due again until 2026. Patient denies any medical concerns. Her most recent lab work as listed below: Lab on 01/16/2015  Component Date Value Ref Range Status  . Sodium 01/16/2015 140  135 - 146 mmol/L Final  . Potassium 01/16/2015 3.7  3.5 - 5.3 mmol/L Final  . Chloride 01/16/2015 100  98 - 110 mmol/L Final  . CO2 01/16/2015 29  20 - 31 mmol/L Final  . Glucose, Bld 01/16/2015 86  70 - 99 mg/dL Final  . BUN 01/16/2015 18  7 - 25 mg/dL Final  . Creat 01/16/2015 0.72  0.50 - 1.05 mg/dL Final  . Total Bilirubin 01/16/2015 0.6  0.2 - 1.2 mg/dL Final  . Alkaline Phosphatase 01/16/2015 42  33 - 130 U/L Final  . AST 01/16/2015 14  10 - 35 U/L Final  . ALT 01/16/2015 14  6 - 29 U/L Final  . Total Protein 01/16/2015 7.6  6.1 - 8.1 g/dL Final  . Albumin 01/16/2015 4.3  3.6 - 5.1 g/dL Final  . Calcium 01/16/2015 9.7  8.6 - 10.4 mg/dL Final  . GFR, Est African American 01/16/2015 >89  >=60 mL/min Final  . GFR, Est Non African American 01/16/2015 >89  >=60 mL/min Final   Comment:   The estimated GFR is a calculation valid for adults (>=40 years old) that uses the CKD-EPI algorithm to adjust for age and sex. It is   not to be used for children, pregnant women, hospitalized patients,    patients on dialysis, or with rapidly changing kidney function. According to the NKDEP, eGFR >89 is normal, 60-89 shows mild impairment, 30-59 shows moderate impairment, 15-29 shows  severe impairment and <15 is ESRD.     Marland Kitchen TSH 01/16/2015 0.161* 0.350 - 4.500 uIU/mL Final  . Cholesterol 01/16/2015 216* 125 - 200 mg/dL Final  . Triglycerides 01/16/2015 122  <150 mg/dL Final  . HDL 01/16/2015 41* >=46 mg/dL Final  . Total CHOL/HDL Ratio 01/16/2015 5.3* <=5.0 Ratio Final  . VLDL 01/16/2015 24  <30 mg/dL Final  . LDL Cholesterol 01/16/2015 151* <130 mg/dL Final   Comment:   Total Cholesterol/HDL Ratio:CHD Risk                        Coronary Heart Disease Risk Table                                        Men       Women          1/2 Average Risk              3.4        3.3              Average Risk              5.0  4.4           2X Average Risk              9.6        7.1           3X Average Risk             23.4       11.0 Use the calculated Patient Ratio above and the CHD Risk table  to determine the patient's CHD Risk.   . WBC 01/16/2015 7.1  4.0 - 10.5 K/uL Final  . RBC 01/16/2015 4.43  3.87 - 5.11 MIL/uL Final  . Hemoglobin 01/16/2015 12.6  12.0 - 15.0 g/dL Final  . HCT 01/16/2015 36.3  36.0 - 46.0 % Final  . MCV 01/16/2015 81.9  78.0 - 100.0 fL Final  . MCH 01/16/2015 28.4  26.0 - 34.0 pg Final  . MCHC 01/16/2015 34.7  30.0 - 36.0 g/dL Final  . RDW 01/16/2015 13.4  11.5 - 15.5 % Final  . Platelets 01/16/2015 303  150 - 400 K/uL Final  . MPV 01/16/2015 10.0  8.6 - 12.4 fL Final  . Neutrophils Relative % 01/16/2015 49  43 - 77 % Final  . Neutro Abs 01/16/2015 3.5  1.7 - 7.7 K/uL Final  . Lymphocytes Relative 01/16/2015 44  12 - 46 % Final  . Lymphs Abs 01/16/2015 3.1  0.7 - 4.0 K/uL Final  . Monocytes Relative 01/16/2015 6  3 - 12 % Final  . Monocytes Absolute 01/16/2015 0.4  0.1 - 1.0 K/uL Final  . Eosinophils Relative 01/16/2015 1  0 - 5 % Final  . Eosinophils Absolute 01/16/2015 0.1  0.0 - 0.7 K/uL Final  . Basophils Relative 01/16/2015 0  0 - 1 % Final  . Basophils Absolute 01/16/2015 0.0  0.0 - 0.1 K/uL Final  . Smear Review 01/16/2015  Criteria for review not met   Final  . Vit D, 25-Hydroxy 01/16/2015 37  30 - 100 ng/mL Final   Comment: Vitamin D Status           25-OH Vitamin D        Deficiency                <20 ng/mL        Insufficiency         20 - 29 ng/mL        Optimal             > or = 30 ng/mL   For 25-OH Vitamin D testing on patients on D2-supplementation and patients for whom quantitation of D2 and D3 fractions is required, the QuestAssureD 25-OH VIT D, (D2,D3), LC/MS/MS is recommended: order code [id number] (patients > 2 yrs).    Past Medical History  Diagnosis Date  . MHA (microangiopathic hemolytic anemia) (HCC)   . Asthma   . Obese   . Fibroadenoma of breast     Right Breast  . Pneumonia 1990  . Bone spur 88    Left Knee  . Heel spur     Right Foot  . Allergy   . Hypertension   . Colon polyps    Past Surgical History  Procedure Laterality Date  . Vaginal hysterectomy      Fibroids  . Cesarean section    . Bunionectomy Right 12/04/2014    '@[facility]'    Current Outpatient Prescriptions on File Prior to Visit  Medication Sig Dispense Refill  . albuterol (PROAIR  HFA) 108 (90 BASE) MCG/ACT inhaler 2 puffs every 4 hours as needed only  if your can't catch your breath 1 Inhaler 0  . Ascorbic Acid (VITAMIN C PO) Take 1 tablet by mouth daily.    . cholecalciferol (VITAMIN D) 1000 UNITS tablet Take 1 tablet (1,000 Units total) by mouth daily. 90 tablet 3  . clotrimazole (LOTRIMIN) 1 % cream Apply 1 application topically 2 (two) times daily. 30 g 4  . Hydrocodone-Acetaminophen 7.5-300 MG TABS take 1 tablet by mouth every 6 hours if needed for pain  0  . triamterene-hydrochlorothiazide (MAXZIDE-25) 37.5-25 MG per tablet Take 1 tablet by mouth every morning. 90 tablet 3  . vitamin E 400 UNIT capsule Take 400 Units by mouth daily.    Marland Kitchen zolpidem (AMBIEN) 5 MG tablet Take 5 mg by mouth at bedtime as needed. For insomnia     No current facility-administered medications on file prior to visit.   Allergies   Allergen Reactions  . Penicillins Other (See Comments)    REACTION: as child   Social History   Social History  . Marital Status: Married    Spouse Name: N/A  . Number of Children: 1  . Years of Education: N/A   Occupational History  . 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 Smoking status: Current Every Day Smoker -- 1.00 packs/day for 11 years    Types: Cigarettes  . Smokeless tobacco: Never Used  . Alcohol Use: No  . Drug Use: No  . Sexual Activity: Yes    Birth Control/ Protection: None   Other Topics Concern  . Not on file   Social History Narrative   Drinks one cup caffeine daily         Family History  Problem Relation Age of Onset  . Heart disease Father     cardiomyopathy (HTN)  . Hypertension Father   . Hypertension Mother   . Hypertension Sister   . Hypertension Brother      Review of Systems  All other systems reviewed and are negative.      Objective:   Physical Exam  Constitutional: She is oriented to person, place, and time. She appears well-developed and well-nourished. No distress.  HENT:  Head: Normocephalic and atraumatic.  Right Ear: External ear normal.  Left Ear: External ear normal.  Nose: Nose normal.  Mouth/Throat: Oropharynx is clear and moist. No oropharyngeal exudate.  Eyes: Conjunctivae and EOM are normal. Pupils are equal, round, and reactive to light. Right eye exhibits no discharge. Left eye exhibits no discharge. No scleral icterus.  Neck: Normal range of motion. Neck supple. No JVD present. No tracheal deviation present. No thyromegaly present.  Cardiovascular: Normal rate, regular rhythm, normal heart sounds and intact distal pulses.  Exam reveals no gallop and no friction rub.   No murmur heard. Pulmonary/Chest: Effort normal and breath sounds normal. No stridor. No respiratory distress. She has no wheezes. She has no rales. She exhibits no tenderness.  Abdominal: Soft. Bowel sounds  are normal. She exhibits no distension and no mass. There is no tenderness. There is no rebound and no guarding.  Musculoskeletal: Normal range of motion. She exhibits no edema or tenderness.  Lymphadenopathy:    She has no cervical adenopathy.  Neurological: She is alert and oriented to person, place, and time. She displays normal reflexes. No cranial nerve deficit. She exhibits normal muscle tone. Coordination normal.  Skin: No rash noted. She is not diaphoretic.  No erythema. No pallor.  Psychiatric: She has a normal mood and affect. Her behavior is normal. Judgment and thought content normal.  Vitals reviewed.         Assessment & Plan:  HLD (hyperlipidemia) - Plan: atorvastatin (LIPITOR) 20 MG tablet, COMPLETE METABOLIC PANEL WITH GFR, Lipid panel  Essential hypertension  Routine general medical examination at a health care facility  Low TSH level - Plan: TSH  Blood pressure is excellent. LDL cholesterol is too high. Given her age, family history, smoking, hypertension, and low HDL, I would recommend an LDL cholesterol well below 130 and is close to 100 as possible. I recommended Lipitor 20 mg by mouth daily and recheck in 3 months. TSH is slightly suppressed. I will recheck that in 3 months as well. If still suppressed at that time, I will perform a radioactive iodine thyroid uptake scan.  Cancer screening is up-to-date. I recommended a flu shot but the patient politely declined. Colonoscopy, mammogram, and Pap smear up-to-date. I recommended smoking cessation. Recheck labs in 3 months.

## 2015-02-10 ENCOUNTER — Ambulatory Visit (INDEPENDENT_AMBULATORY_CARE_PROVIDER_SITE_OTHER): Payer: 59 | Admitting: Podiatry

## 2015-02-10 ENCOUNTER — Encounter: Payer: Self-pay | Admitting: Podiatry

## 2015-02-10 DIAGNOSIS — Z9889 Other specified postprocedural states: Secondary | ICD-10-CM

## 2015-02-10 NOTE — Progress Notes (Signed)
10 weeks post op right foot Austin bunionectomy (12/04/14). Doing well walking in regular shoes. Patient is ready to return to work as scheduled.  No complaints. May use compression socks if feet swells.  Return as needed.

## 2015-02-10 NOTE — Patient Instructions (Signed)
10 weeks following Austin bunionectomy right foot. Recovered well. May resume regular activity as tolerated.  Return as needed.

## 2015-02-11 ENCOUNTER — Ambulatory Visit (INDEPENDENT_AMBULATORY_CARE_PROVIDER_SITE_OTHER): Payer: 59 | Admitting: Physician Assistant

## 2015-02-11 ENCOUNTER — Encounter: Payer: Self-pay | Admitting: Physician Assistant

## 2015-02-11 VITALS — BP 114/78 | HR 104 | Temp 98.0°F | Resp 18 | Wt 175.0 lb

## 2015-02-11 DIAGNOSIS — R519 Headache, unspecified: Secondary | ICD-10-CM

## 2015-02-11 DIAGNOSIS — R51 Headache: Secondary | ICD-10-CM

## 2015-02-11 NOTE — Progress Notes (Signed)
Patient ID: Alison [name] MRN: [id number], DOB: May 08, 1955, 59 y.o. Date of Encounter: 02/11/2015, 3:03 PM    Chief Complaint:  Chief Complaint  Patient presents with  . aches and pains    now headaches x 3 weeks     HPI: 59 y.o. year old AA female says that she has had some headache off and on for the past 3 weeks. Has been taking over-the-counter Advil here and there and would get relief so she just kept  going without giving it much thought. However this morning she was concerned that headache could be coming from high blood pressure so she went to the pharmacy and checked her blood pressure. Reading was 143/90. Called here and appointment was scheduled.  When I asked her what area of her head the headaches have been she points to bilateral temples.  I discussed that usually headaches in that area are secondary to stress/tension headache. She says that she has been out of work recently so doesn't know why she would be having stress headaches now. Also discussed sinus headaches. However she says that she has not been having any congestion or feeling like she has sinus pressure.     Home Meds:   Outpatient Prescriptions Prior to Visit  Medication Sig Dispense Refill  . albuterol (PROAIR HFA) 108 (90 BASE) MCG/ACT inhaler 2 puffs every 4 hours as needed only  if your can't catch your breath 1 Inhaler 0  . Ascorbic Acid (VITAMIN C PO) Take 1 tablet by mouth daily.    Marland Kitchen atorvastatin (LIPITOR) 20 MG tablet Take 1 tablet (20 mg total) by mouth daily. 30 tablet 5  . cholecalciferol (VITAMIN D) 1000 UNITS tablet Take 1 tablet (1,000 Units total) by mouth daily. 90 tablet 3  . clotrimazole (LOTRIMIN) 1 % cream Apply 1 application topically 2 (two) times daily. 30 g 4  . triamterene-hydrochlorothiazide (MAXZIDE-25) 37.5-25 MG per tablet Take 1 tablet by mouth every morning. 90 tablet 3  . vitamin E 400 UNIT capsule Take 400 Units by mouth daily.    Marland Kitchen zolpidem (AMBIEN) 5 MG tablet  Take 5 mg by mouth at bedtime as needed. For insomnia    . Hydrocodone-Acetaminophen 7.5-300 MG TABS take 1 tablet by mouth every 6 hours if needed for pain  0   No facility-administered medications prior to visit.    Allergies:  Allergies  Allergen Reactions  . Penicillins Other (See Comments)    REACTION: as child      Review of Systems: See HPI for pertinent ROS. All other ROS negative.    Physical Exam: Blood pressure 114/78, pulse 104, temperature 98 F (36.7 C), temperature source Oral, resp. rate 18, weight 175 lb (79.379 kg)., Body mass index is 28.67 kg/(m^2). General: WNWD AAF.  Appears in no acute distress. HEENT: No tenderness with percussion of frontal and maxillary sinuses bilaterally. Neck: Supple. No thyromegaly. No lymphadenopathy. Lungs: Clear bilaterally to auscultation without wheezes, rales, or rhonchi. Breathing is unlabored. Heart: Regular rhythm. No murmurs, rubs, or gallops. Msk:  Strength and tone normal for age. Extremities/Skin: Warm and dry. Neuro: Alert and oriented X 3. Moves all extremities spontaneously. Gait is normal. CNII-XII grossly in tact. Psych:  Responds to questions appropriately with a normal affect.     ASSESSMENT AND PLAN:  59 y.o. year old female with  1. Nonintractable headache, unspecified chronicity pattern, unspecified headache type Rechecked blood pressure myself and got 118/80. Also reviewed that nurse here today got 114/78 and that  it office visit here 01/26/15 118/74. Think these readings are all more accurate than the blood pressure cuff at the pharmacy. As well even the reading at the pharmacy was 143/90 and was not significantly elevated to be causing headache. I discussed other possible causes of her headache as I reassured her that I do not think high blood pressure is the cause of her headache. Discussed stress/tension headache. However she says that she is currently out of work and does not feel that she is having  increased amount of stress. Discussed possible sinus headache but she says she really has not been having any congestion or sinus pressure. Headache is only occurring occasionally and is easily controlled with over-the-counter NSAID. Told her to continue this treatment and monitor for a week. If headache is not improved in one week and follow-up.   Marin Olp Jermyn, [place], [facility] 02/11/2015 3:03 PM

## 2015-03-11 ENCOUNTER — Encounter (HOSPITAL_BASED_OUTPATIENT_CLINIC_OR_DEPARTMENT_OTHER): Payer: Self-pay | Admitting: *Deleted

## 2015-03-11 NOTE — H&P (Signed)
General Surgery [facility]  Alison [name] DOB: 11-25-1955 Married / Language: English / Race: Black or African American Female  History of Present Illness   The patient is a 59 year old female who presents with a complaint of Breast problems. Patient is referred by Earnstine Regal for evaluation of abnormal mammogram findings. Patient has annual mammography performed on a regular basis. She has had a previous needle biopsy of the left breast which was benign. This was performed approximately 10 years ago. Patient had routine screening mammography on October 27, 2014. She underwent additional imaging of the left breast on November 07, 2014 which shows a stable 7 mm oval mass at the 6 o'clock position in the left breast and a stable 2 mm oval mass in the 1 o'clock position of the left breast. Both lesions were well-circumscribed and had no worrisome features and were felt to be benign. Follow-up mammogram was recommended in 1 year. Patient has no prior history of breast cancer or significant breast disease. She does describe symptoms associated with her menstrual cycles which represent underlying fibrocystic change. She denies any nipple discharge. She denies any new masses. She denies any breast pain. Patient admits that she is not very compliant with self-examination. There is no family history of breast disease or breast cancer.  Allergies  Penicillin V *PENICILLINS*  Medication History Clotrimazole (1% Cream, External) Active. Triamterene-HCTZ (37.5-25MG  Tablet, Oral) Active. Vitamin D (Ergocalciferol) (50000UNIT Capsule, Oral) Active. Albuterol Sulfate HFA (108 (90 Base)MCG/ACT Aerosol Soln, Inhalation) Active. Ambien (5MG  Tablet, Oral) Active. Vitamin E (400UNIT Capsule, Oral) Active. Medications Reconciled  Vitals Weight: 176.6 lb Height: 65.5in Body Surface Area: 1.92 m Body Mass Index: 28.94 kg/m  Temp.: 61F(Oral)  Pulse: 84  (Regular)  BP: 162/82 (Sitting, Left Arm, Standard)  Physical Exam Earnstine Regal MD; 12/17/2014 10:46 AM)  General - appears comfortable, no distress; not diaphorectic  HEENT - normocephalic; sclerae clear, gaze conjugate; mucous membranes moist, dentition good; voice normal  Neck - symmetric on extension; no palpable anterior or posterior cervical adenopathy; no palpable masses in the thyroid bed  Chest - clear bilaterally without rhonchi, rales, or wheeze  Cor - regular rhythm with normal rate; no significant murmur  Breast - breast are large and pendulous; right breast shows normal nipple areolar complex without any cutaneous lesions; breast parenchyma is soft and uniform without discrete or dominant mass; right axilla is free of adenopathy; left breast shows normal nipple areolar complex; breast parenchyma is soft and uniform without discrete or dominant mass; no tenderness; left axilla is free of adenopathy  Ext - non-tender without significant edema or lymphedema  Neuro - grossly intact; no tremor   Assessment & Plan  ABNORMAL MAMMOGRAM OF LEFT BREAST (R92.8)  SOFT TISSUE MASS, RIGHT SHOULDER  Patient presents for evaluation of bilateral breast based on recent mammographic findings. I reviewed the mammogram report in detail with the patient. All findings appear benign. There are no worrisome findings on her mammographic examination. There is no indication for breast biopsy at the present time.  Patient is encouraged to perform monthly self examination and continue with annual mammography.  Patient does have a soft tissue mass on the right anterior shoulder. She will contact our office if she would like to schedule surgical excision. She was seen in consultation for this problem in July 2016.  Patient also has questions regarding reduction mammoplasty. I have given her the names of 4 plastic surgeons here in [place] that she  may wish to call for consultation.  Patient  should contact our office when she is ready to schedule excision of soft tissue mass from right anterior shoulder.  The risks and benefits of the procedure have been discussed at length with the patient.  The patient understands the proposed procedure, potential alternative treatments, and the course of recovery to be expected.  All of the patient's questions have been answered at this time.  The patient wishes to proceed with surgery.  Earnstine Regal, MD, [facility] Office: [phone]

## 2015-03-12 ENCOUNTER — Encounter (HOSPITAL_BASED_OUTPATIENT_CLINIC_OR_DEPARTMENT_OTHER)
Admission: RE | Admit: 2015-03-12 | Discharge: 2015-03-12 | Disposition: A | Discharge status: Home / Self Care | Payer: 59 | Source: Ambulatory Visit | Attending: Surgery | Admitting: Surgery

## 2015-03-12 ENCOUNTER — Ambulatory Visit: Payer: Self-pay | Admitting: Surgery

## 2015-03-12 DIAGNOSIS — Z01810 Encounter for preprocedural cardiovascular examination: Secondary | ICD-10-CM | POA: Insufficient documentation

## 2015-03-12 DIAGNOSIS — Z01812 Encounter for preprocedural laboratory examination: Secondary | ICD-10-CM | POA: Insufficient documentation

## 2015-03-12 DIAGNOSIS — M799 Soft tissue disorder, unspecified: Secondary | ICD-10-CM | POA: Insufficient documentation

## 2015-03-12 LAB — BASIC METABOLIC PANEL
ANION GAP: 7 (ref 5–15)
BUN: 12 mg/dL (ref 6–20)
CHLORIDE: 105 mmol/L (ref 101–111)
CO2: 30 mmol/L (ref 22–32)
Calcium: 9.8 mg/dL (ref 8.9–10.3)
Creatinine, Ser: 0.8 mg/dL (ref 0.44–1.00)
GFR calc non Af Amer: >60 mL/min (ref >60)
Glucose, Bld: 90 mg/dL (ref 65–99)
POTASSIUM: 4.6 mmol/L (ref 3.5–5.1)
Sodium: 142 mmol/L (ref 135–145)

## 2015-03-16 ENCOUNTER — Ambulatory Visit (HOSPITAL_BASED_OUTPATIENT_CLINIC_OR_DEPARTMENT_OTHER)
Admission: RE | Admit: 2015-03-16 | Discharge: 2015-03-16 | Disposition: A | Discharge status: Home / Self Care | Payer: 59 | Source: Ambulatory Visit | Attending: Surgery | Admitting: Surgery

## 2015-03-16 ENCOUNTER — Encounter (HOSPITAL_BASED_OUTPATIENT_CLINIC_OR_DEPARTMENT_OTHER)
Admission: RE | Disposition: A | Discharge status: Home / Self Care | Payer: Self-pay | Source: Ambulatory Visit | Attending: Surgery

## 2015-03-16 ENCOUNTER — Encounter (HOSPITAL_BASED_OUTPATIENT_CLINIC_OR_DEPARTMENT_OTHER): Payer: Self-pay

## 2015-03-16 ENCOUNTER — Ambulatory Visit (HOSPITAL_BASED_OUTPATIENT_CLINIC_OR_DEPARTMENT_OTHER): Payer: 59 | Admitting: Anesthesiology

## 2015-03-16 DIAGNOSIS — I10 Essential (primary) hypertension: Secondary | ICD-10-CM | POA: Diagnosis not present

## 2015-03-16 DIAGNOSIS — M7989 Other specified soft tissue disorders: Secondary | ICD-10-CM | POA: Diagnosis present

## 2015-03-16 DIAGNOSIS — E669 Obesity, unspecified: Secondary | ICD-10-CM | POA: Insufficient documentation

## 2015-03-16 DIAGNOSIS — J45909 Unspecified asthma, uncomplicated: Secondary | ICD-10-CM | POA: Diagnosis not present

## 2015-03-16 DIAGNOSIS — Z8249 Family history of ischemic heart disease and other diseases of the circulatory system: Secondary | ICD-10-CM | POA: Diagnosis not present

## 2015-03-16 DIAGNOSIS — D171 Benign lipomatous neoplasm of skin and subcutaneous tissue of trunk: Secondary | ICD-10-CM | POA: Insufficient documentation

## 2015-03-16 DIAGNOSIS — R222 Localized swelling, mass and lump, trunk: Secondary | ICD-10-CM | POA: Diagnosis present

## 2015-03-16 DIAGNOSIS — Z90710 Acquired absence of both cervix and uterus: Secondary | ICD-10-CM | POA: Insufficient documentation

## 2015-03-16 DIAGNOSIS — Z79899 Other long term (current) drug therapy: Secondary | ICD-10-CM | POA: Insufficient documentation

## 2015-03-16 HISTORY — PX: MASS EXCISION: SHX2000

## 2015-03-16 HISTORY — DX: Headache, unspecified: R51.9

## 2015-03-16 HISTORY — DX: Headache: R51

## 2015-03-16 SURGERY — EXCISION MASS
Anesthesia: General | Site: Shoulder | Laterality: Right

## 2015-03-16 MED ORDER — BUPIVACAINE HCL (PF) 0.5 % IJ SOLN
INTRAMUSCULAR | Status: AC
  Filled 2015-03-16: qty 30

## 2015-03-16 MED ORDER — CIPROFLOXACIN IN D5W 400 MG/200ML IV SOLN
INTRAVENOUS | Status: AC
  Filled 2015-03-16: qty 200

## 2015-03-16 MED ORDER — SCOPOLAMINE 1 MG/3DAYS TD PT72: 1.0000 | MEDICATED_PATCH | Freq: Once | TRANSDERMAL | Status: DC

## 2015-03-16 MED ORDER — FENTANYL CITRATE (PF) 100 MCG/2ML IJ SOLN
INTRAMUSCULAR | Status: AC
  Filled 2015-03-16: qty 2

## 2015-03-16 MED ORDER — EPHEDRINE SULFATE 50 MG/ML IJ SOLN
INTRAMUSCULAR | Status: AC
  Filled 2015-03-16: qty 1

## 2015-03-16 MED ORDER — DEXAMETHASONE SODIUM PHOSPHATE 4 MG/ML IJ SOLN
INTRAMUSCULAR | Status: DC | PRN
  Administered 2015-03-16: 10 mg via INTRAVENOUS

## 2015-03-16 MED ORDER — HYDROCODONE-ACETAMINOPHEN 5-325 MG PO TABS: 1.0000 | ORAL_TABLET | ORAL | Status: DC | PRN

## 2015-03-16 MED ORDER — CIPROFLOXACIN IN D5W 400 MG/200ML IV SOLN
400.0000 mg | INTRAVENOUS | Status: AC
  Administered 2015-03-16 (×2): 400 mg via INTRAVENOUS

## 2015-03-16 MED ORDER — GLYCOPYRROLATE 0.2 MG/ML IJ SOLN
0.2000 mg | Freq: Once | INTRAMUSCULAR | Status: DC | PRN
Start: 2015-03-16 — End: 2015-03-16

## 2015-03-16 MED ORDER — MIDAZOLAM HCL 2 MG/2ML IJ SOLN: 1.0000 mg | INTRAMUSCULAR | Status: DC | PRN

## 2015-03-16 MED ORDER — BUPIVACAINE HCL (PF) 0.5 % IJ SOLN
INTRAMUSCULAR | Status: DC | PRN
  Administered 2015-03-16: 20 mL

## 2015-03-16 MED ORDER — LIDOCAINE HCL (CARDIAC) 20 MG/ML IV SOLN
INTRAVENOUS | Status: AC
  Filled 2015-03-16: qty 5

## 2015-03-16 MED ORDER — PHENYLEPHRINE HCL 10 MG/ML IJ SOLN
INTRAMUSCULAR | Status: AC
  Filled 2015-03-16: qty 1

## 2015-03-16 MED ORDER — EPHEDRINE SULFATE 50 MG/ML IJ SOLN
INTRAMUSCULAR | Status: DC | PRN
  Administered 2015-03-16: 10 mg via INTRAVENOUS

## 2015-03-16 MED ORDER — ONDANSETRON HCL 4 MG/2ML IJ SOLN
INTRAMUSCULAR | Status: AC
  Filled 2015-03-16: qty 2

## 2015-03-16 MED ORDER — FENTANYL CITRATE (PF) 100 MCG/2ML IJ SOLN: 50.0000 ug | INTRAMUSCULAR | Status: DC | PRN

## 2015-03-16 MED ORDER — PROPOFOL 10 MG/ML IV BOLUS
INTRAVENOUS | Status: AC
  Filled 2015-03-16: qty 40

## 2015-03-16 MED ORDER — BUPIVACAINE-EPINEPHRINE (PF) 0.5% -1:200000 IJ SOLN
INTRAMUSCULAR | Status: AC
  Filled 2015-03-16: qty 30

## 2015-03-16 MED ORDER — LACTATED RINGERS IV SOLN
INTRAVENOUS | Status: DC
  Administered 2015-03-16: 07:00:00 via INTRAVENOUS

## 2015-03-16 MED ORDER — SUCCINYLCHOLINE CHLORIDE 20 MG/ML IJ SOLN
INTRAMUSCULAR | Status: AC
  Filled 2015-03-16: qty 1

## 2015-03-16 MED ORDER — DEXAMETHASONE SODIUM PHOSPHATE 10 MG/ML IJ SOLN
INTRAMUSCULAR | Status: AC
  Filled 2015-03-16: qty 1

## 2015-03-16 MED ORDER — ONDANSETRON HCL 4 MG/2ML IJ SOLN
INTRAMUSCULAR | Status: DC | PRN
  Administered 2015-03-16: 4 mg via INTRAVENOUS

## 2015-03-16 MED ORDER — MIDAZOLAM HCL 2 MG/2ML IJ SOLN
INTRAMUSCULAR | Status: AC
  Filled 2015-03-16: qty 2

## 2015-03-16 MED ORDER — LIDOCAINE HCL (CARDIAC) 20 MG/ML IV SOLN
INTRAVENOUS | Status: DC | PRN
  Administered 2015-03-16: 50 mg via INTRAVENOUS

## 2015-03-16 MED ORDER — PROPOFOL 10 MG/ML IV BOLUS
INTRAVENOUS | Status: DC | PRN
  Administered 2015-03-16: 200 mg via INTRAVENOUS

## 2015-03-16 MED ORDER — ATROPINE SULFATE 0.4 MG/ML IJ SOLN
INTRAMUSCULAR | Status: AC
  Filled 2015-03-16: qty 1

## 2015-03-16 MED ORDER — FENTANYL CITRATE (PF) 100 MCG/2ML IJ SOLN
INTRAMUSCULAR | Status: DC | PRN
  Administered 2015-03-16: 100 ug via INTRAVENOUS

## 2015-03-16 MED ORDER — GLYCOPYRROLATE 0.2 MG/ML IJ SOLN
INTRAMUSCULAR | Status: AC
  Filled 2015-03-16: qty 1

## 2015-03-16 SURGICAL SUPPLY — 39 items
APL SKNCLS STERI-STRIP NONHPOA (GAUZE/BANDAGES/DRESSINGS) ×1
BENZOIN TINCTURE PRP APPL 2/3 (GAUZE/BANDAGES/DRESSINGS) ×1 IMPLANT
BLADE SURG 15 STRL LF DISP TIS (BLADE) ×1 IMPLANT
BLADE SURG 15 STRL SS (BLADE) ×2
CHLORAPREP W/TINT 26ML (MISCELLANEOUS) ×2 IMPLANT
CLEANER CAUTERY TIP 5X5 PAD (MISCELLANEOUS) IMPLANT
COVER BACK TABLE 60X90IN (DRAPES) ×2 IMPLANT
COVER MAYO STAND STRL (DRAPES) ×2 IMPLANT
DECANTER SPIKE VIAL GLASS SM (MISCELLANEOUS) ×1 IMPLANT
DRAPE LAPAROTOMY 100X72 PEDS (DRAPES) ×1 IMPLANT
DRAPE U-SHAPE 76X120 STRL (DRAPES) IMPLANT
DRAPE UTILITY XL STRL (DRAPES) ×2 IMPLANT
DRSG TEGADERM 4X4.75 (GAUZE/BANDAGES/DRESSINGS) ×1 IMPLANT
ELECT REM PT RETURN 9FT ADLT (ELECTROSURGICAL) ×2
ELECTRODE REM PT RTRN 9FT ADLT (ELECTROSURGICAL) ×1 IMPLANT
GLOVE BIO SURGEON STRL SZ 6.5 (GLOVE) ×1 IMPLANT
GLOVE BIOGEL PI IND STRL 7.0 (GLOVE) IMPLANT
GLOVE BIOGEL PI INDICATOR 7.0 (GLOVE) ×1
GLOVE SURG ORTHO 8.0 STRL STRW (GLOVE) ×2 IMPLANT
GOWN STRL REUS W/ TWL LRG LVL3 (GOWN DISPOSABLE) ×1 IMPLANT
GOWN STRL REUS W/ TWL XL LVL3 (GOWN DISPOSABLE) ×1 IMPLANT
GOWN STRL REUS W/TWL LRG LVL3 (GOWN DISPOSABLE) ×2
GOWN STRL REUS W/TWL XL LVL3 (GOWN DISPOSABLE) ×2
NDL HYPO 25X1 1.5 SAFETY (NEEDLE) ×1 IMPLANT
NEEDLE HYPO 25X1 1.5 SAFETY (NEEDLE) ×2 IMPLANT
PACK BASIN DAY SURGERY FS (CUSTOM PROCEDURE TRAY) ×2 IMPLANT
PAD CLEANER CAUTERY TIP 5X5 (MISCELLANEOUS) ×1
PENCIL BUTTON HOLSTER BLD 10FT (ELECTRODE) ×2 IMPLANT
SHEET MEDIUM DRAPE 40X70 STRL (DRAPES) IMPLANT
SLEEVE SCD COMPRESS KNEE MED (MISCELLANEOUS) ×1 IMPLANT
SPONGE GAUZE 4X4 12PLY STER LF (GAUZE/BANDAGES/DRESSINGS) ×2 IMPLANT
STRIP CLOSURE SKIN 1/2X4 (GAUZE/BANDAGES/DRESSINGS) ×1 IMPLANT
SUT ETHILON 3 0 PS 1 (SUTURE) IMPLANT
SUT MNCRL AB 4-0 PS2 18 (SUTURE) ×1 IMPLANT
SUT VICRYL 3-0 CR8 SH (SUTURE) ×2 IMPLANT
SUT VICRYL 4-0 PS2 18IN ABS (SUTURE) IMPLANT
SYR CONTROL 10ML LL (SYRINGE) ×2 IMPLANT
TOWEL OR 17X24 6PK STRL BLUE (TOWEL DISPOSABLE) ×3 IMPLANT
TOWEL OR NON WOVEN STRL DISP B (DISPOSABLE) ×2 IMPLANT

## 2015-03-16 NOTE — Transfer of Care (Signed)
Immediate Anesthesia Transfer of Care Note  Patient: Alison [name]  Procedure(s) Performed: Procedure(s): EXCISION OF RIGHT ANTERIOR SHOULDER SOFT TISSUE MASS (Right)  Patient Location: PACU  Anesthesia Type:General  Level of Consciousness: awake and patient cooperative  Airway & Oxygen Therapy: Patient Spontanous Breathing and Patient connected to face mask oxygen  Post-op Assessment: Report given to RN and Post -op Vital signs reviewed and stable  Post vital signs: Reviewed and stable  Last Vitals:  Filed Vitals:   03/16/15 0820 03/16/15 0822  BP: 148/80   Pulse:  76  Temp:    Resp:  12    Complications: No apparent anesthesia complications

## 2015-03-16 NOTE — Discharge Instructions (Signed)

## 2015-03-16 NOTE — Anesthesia Postprocedure Evaluation (Signed)
Anesthesia Post Note  Patient: Alison [name]  Procedure(s) Performed: Procedure(s) (LRB): EXCISION OF RIGHT ANTERIOR SHOULDER SOFT TISSUE MASS (Right)  Patient location during evaluation: PACU Anesthesia Type: General Level of consciousness: awake and awake and alert Pain management: pain level controlled Vital Signs Assessment: post-procedure vital signs reviewed and stable Respiratory status: spontaneous breathing Anesthetic complications: no    Last Vitals:  Filed Vitals:   03/16/15 0845 03/16/15 0855  BP: 140/85 145/98  Pulse: 74 66  Temp:    Resp: 17 14    Last Pain:  Filed Vitals:   03/16/15 0900  PainSc: 0-No pain                 Arlee Bossard COKER

## 2015-03-16 NOTE — Interval H&P Note (Signed)
History and Physical Interval Note:  03/16/2015 7:03 AM  Alison [name]  has presented today for surgery, with the diagnosis of RIGHT ANTERIOR SHOULDER SOFT TISSUE MASS 10X7X3CM.  The various methods of treatment have been discussed with the patient and family. After consideration of risks, benefits and other options for treatment, the patient has consented to    Procedure(s): EXCISION OF RIGHT ANTERIOR SHOULDER SOFT TISSUE MASS (Right) as a surgical intervention .    The patient's history has been reviewed, patient examined, no change in status, stable for surgery.  I have reviewed the patient's chart and labs.  Questions were answered to the patient's satisfaction.    Earnstine Regal, MD, [facility] Office: [place]

## 2015-03-16 NOTE — Anesthesia Procedure Notes (Signed)
Procedure Name: LMA Insertion Date/Time: 03/16/2015 7:38 AM Performed by: Toula Moos L Pre-anesthesia Checklist: Patient identified, Emergency Drugs available, Suction available and Patient being monitored Patient Re-evaluated:Patient Re-evaluated prior to inductionOxygen Delivery Method: Circle System Utilized Preoxygenation: Pre-oxygenation with 100% oxygen Intubation Type: IV induction Ventilation: Mask ventilation without difficulty LMA: LMA inserted LMA Size: 4.0 Number of attempts: 1 Airway Equipment and Method: Bite block Placement Confirmation: positive ETCO2 Tube secured with: Tape Dental Injury: Teeth and Oropharynx as per pre-operative assessment

## 2015-03-16 NOTE — Brief Op Note (Signed)
03/16/2015  8:12 AM  PATIENT:  Alison [name]  59 y.o. female  PRE-OPERATIVE DIAGNOSIS:  RIGHT ANTERIOR SHOULDER SOFT TISSUE MASS  POST-OPERATIVE DIAGNOSIS:  RIGHT ANTERIOR SHOULDER SOFT TISSUE MASS  PROCEDURE:  Procedure(s): EXCISION OF RIGHT ANTERIOR SHOULDER SOFT TISSUE MASS (Right) (12x9x3 cm)  SURGEON:  Surgeon(s) and Role:    * Armandina Gemma, MD - Primary  ANESTHESIA:   general  EBL:  minimal  BLOOD ADMINISTERED:none  DRAINS: none   LOCAL MEDICATIONS USED:  MARCAINE     SPECIMEN:  Excision  DISPOSITION OF SPECIMEN:  PATHOLOGY  COUNTS:  YES  TOURNIQUET:  * No tourniquets in log *  DICTATION: .Other Dictation: Dictation Number [id number]  PLAN OF CARE: Discharge to home after PACU  PATIENT DISPOSITION:  PACU - hemodynamically stable.   Delay start of Pharmacological VTE agent (>24hrs) due to surgical blood loss or risk of bleeding: yes  Earnstine Regal, MD, [facility] Office: [phone]

## 2015-03-16 NOTE — Anesthesia Preprocedure Evaluation (Signed)
Anesthesia Evaluation  Patient identified by MRN, date of birth, ID band Patient awake    Reviewed: Allergy & Precautions, NPO status , Patient's Chart, lab work & pertinent test results  Airway Mallampati: II  TM Distance: >3 FB Neck ROM: Full    Dental  (+) Teeth Intact, Dental Advisory Given   Pulmonary Current Smoker,    breath sounds clear to auscultation       Cardiovascular hypertension,  Rhythm:Regular Rate:Normal     Neuro/Psych    GI/Hepatic   Endo/Other    Renal/GU      Musculoskeletal   Abdominal   Peds  Hematology   Anesthesia Other Findings   Reproductive/Obstetrics                             Anesthesia Physical Anesthesia Plan  ASA: II  Anesthesia Plan: General   Post-op Pain Management:    Induction: Intravenous  Airway Management Planned: LMA  Additional Equipment:   Intra-op Plan:   Post-operative Plan: Extubation in OR  Informed Consent: I have reviewed the patients History and Physical, chart, labs and discussed the procedure including the risks, benefits and alternatives for the proposed anesthesia with the patient or authorized representative who has indicated his/her understanding and acceptance.   Dental advisory given  Plan Discussed with: CRNA and Anesthesiologist  Anesthesia Plan Comments:         Anesthesia Quick Evaluation

## 2015-03-17 ENCOUNTER — Encounter (HOSPITAL_BASED_OUTPATIENT_CLINIC_OR_DEPARTMENT_OTHER): Payer: Self-pay | Admitting: Surgery

## 2015-03-18 NOTE — Progress Notes (Signed)
Quick Note:  Please contact patient and notify of benign pathology results.  Stratton Villwock M. Windsor Zirkelbach, MD, FACS [facility] Office: [phone]   ______ 

## 2015-03-18 NOTE — Op Note (Signed)
Alison [name], CORGAN NO.:  [id number]  MEDICAL RECORD NO.:  [id number]  LOCATION:                               FACILITY:  [facility]  PHYSICIAN:  Earnstine Regal, MD      DATE OF BIRTH:  November 19, 1955  DATE OF PROCEDURE:  03/16/2015                              OPERATIVE REPORT   PREOP DIAGNOSIS:  Soft tissue mass, right anterior shoulder.  POSTOP DIAGNOSIS:  Soft tissue mass, right anterior shoulder.  PROCEDURE:  Excision soft tissue mass, right anterior shoulder (12 x 9 x 3 cm).  SURGEON:  Earnstine Regal, MD, FACS  ANESTHESIA:  General per Dr. Roberts Gaudy  ESTIMATED BLOOD LOSS:  Minimal.  PREPARATION:  ChloraPrep.  COMPLICATIONS:  None.  INDICATIONS:  The patient is a 59 year old female with an enlarging soft tissue mass on the right anterior shoulder.  She now comes to the operating room for excision.  BODY OF REPORT:  Procedure was done in OR #3 at the [facility].  The patient was brought to the operating room, placed in supine position on the operating room table.  Following administration of general anesthesia, the patient was positioned and then prepped and draped in the usual aseptic fashion.  After ascertaining that an adequate level of anesthesia had been achieved, a 5 cm incision was made with a #15 blade.  Dissection was carried in the subcutaneous tissues. There was a soft tissue mass, which appears multilobular.  It was gently dissected out the subcutaneous tissues using the electrocautery for hemostasis.  Mass was closely approximated with the dermis.  It also extends down to the underlying muscle fascia.  The mass was excised in its entirety with electrocautery use for hemostasis.  The mass measures 12 x 9 x 3 cm and is submitted in its entirety to Pathology for review.  Good hemostasis was achieved throughout the wound.  Local anesthetic was infiltrated circumferentially.  Subcutaneous tissues were closed with interrupted  3-0 Vicryl sutures.  Skin was closed with a running 4-0 Monocryl subcuticular suture.  Wound was washed and dried.  Benzoin and Steri- Strips were applied.  Sterile dressings were applied.  The patient was awakened from anesthesia and brought to the recovery room.  The patient tolerated the procedure well.   Earnstine Regal, MD, [facility] Office: [phone]    TMG/MEDQ  D:  03/16/2015  T:  03/16/2015  Job:  [id number]

## 2015-06-17 ENCOUNTER — Other Ambulatory Visit: Payer: Self-pay | Admitting: Family Medicine

## 2015-06-17 DIAGNOSIS — B353 Tinea pedis: Secondary | ICD-10-CM

## 2015-06-17 MED ORDER — CLOTRIMAZOLE 1 % EX CREA: 1.0000 "application " | TOPICAL_CREAM | Freq: Two times a day (BID) | CUTANEOUS | Status: DC

## 2015-10-02 ENCOUNTER — Telehealth: Payer: Self-pay | Admitting: Family Medicine

## 2015-10-02 DIAGNOSIS — E785 Hyperlipidemia, unspecified: Secondary | ICD-10-CM

## 2015-10-02 NOTE — Telephone Encounter (Addendum)
Pt is requesting a refill of Atorvastatin to be sent in to the [facility]. She will come in for an OV if needed.

## 2015-10-05 MED ORDER — ATORVASTATIN CALCIUM 20 MG PO TABS: 20.0000 mg | ORAL_TABLET | Freq: Every day | ORAL | Status: DC

## 2015-10-05 NOTE — Telephone Encounter (Signed)
Medication called/sent to requested pharmacy  

## 2015-10-06 ENCOUNTER — Telehealth: Payer: Self-pay | Admitting: Family Medicine

## 2015-10-06 MED ORDER — TRIAMTERENE-HCTZ 37.5-25 MG PO TABS: 1.0000 | ORAL_TABLET | Freq: Every morning | ORAL | Status: DC

## 2015-10-06 NOTE — Telephone Encounter (Signed)
Prescription sent to pharmacy.

## 2015-10-06 NOTE — Telephone Encounter (Signed)
PATIENT NEEDS HER HYDROCHOLORIZIDE SENT TO [facility]

## 2016-01-27 ENCOUNTER — Other Ambulatory Visit: Payer: 59

## 2016-01-27 ENCOUNTER — Other Ambulatory Visit: Payer: Self-pay | Admitting: Family Medicine

## 2016-01-27 DIAGNOSIS — E559 Vitamin D deficiency, unspecified: Secondary | ICD-10-CM

## 2016-01-27 DIAGNOSIS — Z79899 Other long term (current) drug therapy: Secondary | ICD-10-CM

## 2016-01-27 DIAGNOSIS — E785 Hyperlipidemia, unspecified: Secondary | ICD-10-CM

## 2016-01-27 DIAGNOSIS — I10 Essential (primary) hypertension: Secondary | ICD-10-CM

## 2016-01-27 DIAGNOSIS — Z0000 Encounter for general adult medical examination without abnormal findings: Secondary | ICD-10-CM

## 2016-01-27 LAB — CBC WITH DIFFERENTIAL/PLATELET
BASOS PCT: 0 %
Basophils Absolute: 0 cells/uL (ref 0–200)
EOS PCT: 5 %
Eosinophils Absolute: 345 cells/uL (ref 15–500)
HCT: 37.8 % (ref 35.0–45.0)
HEMOGLOBIN: 12.2 g/dL (ref 12.0–15.0)
LYMPHS ABS: 3381 {cells}/uL (ref 850–3900)
LYMPHS PCT: 49 %
MCH: 28.5 pg (ref 27.0–33.0)
MCHC: 32.3 g/dL (ref 32.0–36.0)
MCV: 88.3 fL (ref 80.0–100.0)
MPV: 9.8 fL (ref 7.5–12.5)
Monocytes Absolute: 552 cells/uL (ref 200–950)
Monocytes Relative: 8 %
NEUTROS PCT: 38 %
Neutro Abs: 2622 cells/uL (ref 1500–7800)
Platelets: 296 10*3/uL (ref 140–400)
RBC: 4.28 MIL/uL (ref 3.80–5.10)
RDW: 13.4 % (ref 11.0–15.0)
WBC: 6.9 10*3/uL (ref 3.8–10.8)

## 2016-01-27 LAB — COMPLETE METABOLIC PANEL WITH GFR
ALBUMIN: 4 g/dL (ref 3.6–5.1)
ALK PHOS: 56 U/L (ref 33–130)
ALT: 20 U/L (ref 6–29)
AST: 19 U/L (ref 10–35)
BUN: 16 mg/dL (ref 7–25)
CALCIUM: 9.5 mg/dL (ref 8.6–10.4)
CO2: 31 mmol/L (ref 20–31)
CREATININE: 0.77 mg/dL (ref 0.50–1.05)
Chloride: 103 mmol/L (ref 98–110)
GFR, Est African American: >89 mL/min (ref >=60)
GFR, Est Non African American: 85 mL/min (ref >=60)
GLUCOSE: 99 mg/dL (ref 70–99)
POTASSIUM: 4.2 mmol/L (ref 3.5–5.3)
SODIUM: 143 mmol/L (ref 135–146)
TOTAL PROTEIN: 6.9 g/dL (ref 6.1–8.1)
Total Bilirubin: 0.4 mg/dL (ref 0.2–1.2)

## 2016-01-27 LAB — LIPID PANEL
Cholesterol: 190 mg/dL (ref 125–200)
HDL: 48 mg/dL (ref >=46)
LDL CALC: 112 mg/dL (ref <130)
Total CHOL/HDL Ratio: 4 Ratio (ref <=5.0)
Triglycerides: 149 mg/dL (ref <150)
VLDL: 30 mg/dL (ref <30)

## 2016-01-27 LAB — TSH: TSH: 0.49 mIU/L

## 2016-01-28 LAB — VITAMIN D 25 HYDROXY (VIT D DEFICIENCY, FRACTURES): VIT D 25 HYDROXY: 31 ng/mL (ref 30–100)

## 2016-02-05 ENCOUNTER — Encounter: Payer: Self-pay | Admitting: Family Medicine

## 2016-02-05 ENCOUNTER — Ambulatory Visit (INDEPENDENT_AMBULATORY_CARE_PROVIDER_SITE_OTHER): Payer: 59 | Admitting: Family Medicine

## 2016-02-05 VITALS — BP 100/70 | HR 78 | Temp 98.6°F | Resp 18 | Ht 65.5 in | Wt 167.0 lb

## 2016-02-05 DIAGNOSIS — Z0000 Encounter for general adult medical examination without abnormal findings: Secondary | ICD-10-CM

## 2016-02-05 DIAGNOSIS — E7800 Pure hypercholesterolemia, unspecified: Secondary | ICD-10-CM

## 2016-02-05 DIAGNOSIS — I10 Essential (primary) hypertension: Secondary | ICD-10-CM | POA: Diagnosis not present

## 2016-02-05 MED ORDER — ALBUTEROL SULFATE HFA 108 (90 BASE) MCG/ACT IN AERS: INHALATION_SPRAY | RESPIRATORY_TRACT | 0 refills | Status: DC

## 2016-02-05 MED ORDER — ALBUTEROL SULFATE HFA 108 (90 BASE) MCG/ACT IN AERS: INHALATION_SPRAY | RESPIRATORY_TRACT | 2 refills | Status: DC

## 2016-02-05 NOTE — Progress Notes (Signed)
Subjective:    Patient ID: Alison [name], female    DOB: Feb 28, 1956, 60 y.o.   MRN: [id number]  HPI  Very pleasant 60 yo AAF here for CPE.  Has pap/pelvic performed at GYN.  Scheduling mammogram.  Had colonosocpy last year with polyps and due again in 5 years.  At last appt, ldl was 151.  Suggested statin.  Never took statin but lost ~10 lbs and changed diet and now LDL is 112!.  Tsh is also normal.  Still smoking. Appointment on 01/27/2016  Component Date Value Ref Range Status  . Sodium 01/27/2016 143  135 - 146 mmol/L Final  . Potassium 01/27/2016 4.2  3.5 - 5.3 mmol/L Final  . Chloride 01/27/2016 103  98 - 110 mmol/L Final  . CO2 01/27/2016 31  20 - 31 mmol/L Final  . Glucose, Bld 01/27/2016 99  70 - 99 mg/dL Final  . BUN 01/27/2016 16  7 - 25 mg/dL Final  . Creat 01/27/2016 0.77  0.50 - 1.05 mg/dL Final   Comment:   For patients > or = 59 years of age: The upper reference limit for Creatinine is approximately 13% higher for people identified as African-American.     . Total Bilirubin 01/27/2016 0.4  0.2 - 1.2 mg/dL Final  . Alkaline Phosphatase 01/27/2016 56  33 - 130 U/L Final  . AST 01/27/2016 19  10 - 35 U/L Final  . ALT 01/27/2016 20  6 - 29 U/L Final  . Total Protein 01/27/2016 6.9  6.1 - 8.1 g/dL Final  . Albumin 01/27/2016 4.0  3.6 - 5.1 g/dL Final  . Calcium 01/27/2016 9.5  8.6 - 10.4 mg/dL Final  . GFR, Est African American 01/27/2016 >89  >=60 mL/min Final  . GFR, Est Non African American 01/27/2016 85  >=60 mL/min Final  . TSH 01/27/2016 0.49  mIU/L Final   Comment:   Reference Range   > or = 20 Years  0.40-4.50   Pregnancy Range First trimester  0.26-2.66 Second trimester 0.55-2.73 Third trimester  0.43-2.91     . Cholesterol 01/27/2016 190  125 - 200 mg/dL Final  . Triglycerides 01/27/2016 149  <150 mg/dL Final  . HDL 01/27/2016 48  >=46 mg/dL Final  . Total CHOL/HDL Ratio 01/27/2016 4.0  <=5.0 Ratio Final  . VLDL 01/27/2016 30  <30 mg/dL Final    . LDL Cholesterol 01/27/2016 112  <130 mg/dL Final   Comment:   Total Cholesterol/HDL Ratio:CHD Risk                        Coronary Heart Disease Risk Table                                        Men       Women          1/2 Average Risk              3.4        3.3              Average Risk              5.0        4.4           2X Average Risk              9.6  7.1           3X Average Risk             23.4       11.0 Use the calculated Patient Ratio above and the CHD Risk table  to determine the patient's CHD Risk.   . WBC 01/27/2016 6.9  3.8 - 10.8 K/uL Final  . RBC 01/27/2016 4.28  3.80 - 5.10 MIL/uL Final  . Hemoglobin 01/27/2016 12.2  12.0 - 15.0 g/dL Final  . HCT 01/27/2016 37.8  35.0 - 45.0 % Final  . MCV 01/27/2016 88.3  80.0 - 100.0 fL Final  . MCH 01/27/2016 28.5  27.0 - 33.0 pg Final  . MCHC 01/27/2016 32.3  32.0 - 36.0 g/dL Final  . RDW 01/27/2016 13.4  11.0 - 15.0 % Final  . Platelets 01/27/2016 296  140 - 400 K/uL Final  . MPV 01/27/2016 9.8  7.5 - 12.5 fL Final  . Neutro Abs 01/27/2016 2622  1,500 - 7,800 cells/uL Final  . Lymphs Abs 01/27/2016 3381  850 - 3,900 cells/uL Final  . Monocytes Absolute 01/27/2016 552  200 - 950 cells/uL Final  . Eosinophils Absolute 01/27/2016 345  15 - 500 cells/uL Final  . Basophils Absolute 01/27/2016 0  0 - 200 cells/uL Final  . Neutrophils Relative % 01/27/2016 38  % Final  . Lymphocytes Relative 01/27/2016 49  % Final  . Monocytes Relative 01/27/2016 8  % Final  . Eosinophils Relative 01/27/2016 5  % Final  . Basophils Relative 01/27/2016 0  % Final  . Smear Review 01/27/2016 Criteria for review not met   Final  . Vit D, 25-Hydroxy 01/28/2016 31  30 - 100 ng/mL Final   Comment: Vitamin D Status           25-OH Vitamin D        Deficiency                <20 ng/mL        Insufficiency         20 - 29 ng/mL        Optimal             > or = 30 ng/mL   For 25-OH Vitamin D testing on patients on D2-supplementation  and patients for whom quantitation of D2 and D3 fractions is required, the QuestAssureD 25-OH VIT D, (D2,D3), LC/MS/MS is recommended: order code [id number] (patients > 2 yrs).    Past Medical History:  Diagnosis Date  . Allergy   . Asthma   . Bone spur 88   Left Knee  . Colon polyps   . Fibroadenoma of breast    Right Breast  . Headache   . Heel spur    Right Foot  . Hypertension   . Obese   . Pneumonia 1990   Past Surgical History:  Procedure Laterality Date  . BUNIONECTOMY Right 12/04/2014   '@[facility]'   . CESAREAN SECTION    . MASS EXCISION Right 03/16/2015   Procedure: EXCISION OF RIGHT ANTERIOR SHOULDER SOFT TISSUE MASS;  Surgeon: Armandina Gemma, MD;  Location: [place];  Service: General;  Laterality: Right;  Marland Kitchen VAGINAL HYSTERECTOMY     Fibroids   Current Outpatient Prescriptions on File Prior to Visit  Medication Sig Dispense Refill  . Ascorbic Acid (VITAMIN C PO) Take 1 tablet by mouth daily.    . cholecalciferol (VITAMIN D) 1000 UNITS tablet Take 1 tablet (1,000 Units total)  by mouth daily. 90 tablet 3  . clotrimazole (LOTRIMIN) 1 % cream Apply 1 application topically 2 (two) times daily. 30 g 4  . triamterene-hydrochlorothiazide (MAXZIDE-25) 37.5-25 MG tablet Take 1 tablet by mouth every morning. 90 tablet 3  . vitamin E 400 UNIT capsule Take 400 Units by mouth daily.    Marland Kitchen zolpidem (AMBIEN) 5 MG tablet Take 5 mg by mouth at bedtime as needed. For insomnia     No current facility-administered medications on file prior to visit.    Allergies  Allergen Reactions  . Penicillins Other (See Comments)    REACTION: as child   Social History   Social History  . Marital status: Married    Spouse name: N/A  . Number of children: 1  . Years of education: N/A   Occupational History  . 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 Smoking status: Current Every Day Smoker    Packs/day: 0.50    Years: 11.00    Types: Cigarettes   . Smokeless tobacco: Never Used  . Alcohol use No  . Drug use: No  . Sexual activity: Yes    Birth control/ protection: None   Other Topics Concern  . Not on file   Social History Narrative   Drinks one cup caffeine daily         Family History  Problem Relation Age of Onset  . Heart disease Father     cardiomyopathy (HTN)  . Hypertension Father   . Hypertension Mother   . Hypertension Sister   . Hypertension Brother      Review of Systems  All other systems reviewed and are negative.      Objective:   Physical Exam  Constitutional: She is oriented to person, place, and time. She appears well-developed and well-nourished. No distress.  HENT:  Head: Normocephalic and atraumatic.  Right Ear: External ear normal.  Left Ear: External ear normal.  Nose: Nose normal.  Mouth/Throat: Oropharynx is clear and moist. No oropharyngeal exudate.  Eyes: Conjunctivae and EOM are normal. Pupils are equal, round, and reactive to light. Right eye exhibits no discharge. Left eye exhibits no discharge. No scleral icterus.  Neck: Normal range of motion. Neck supple. No JVD present. No tracheal deviation present. No thyromegaly present.  Cardiovascular: Normal rate, regular rhythm, normal heart sounds and intact distal pulses.  Exam reveals no gallop and no friction rub.   No murmur heard. Pulmonary/Chest: Effort normal and breath sounds normal. No stridor. No respiratory distress. She has no wheezes. She has no rales. She exhibits no tenderness.  Abdominal: Soft. Bowel sounds are normal. She exhibits no distension and no mass. There is no tenderness. There is no rebound and no guarding.  Musculoskeletal: Normal range of motion. She exhibits no edema or tenderness.  Lymphadenopathy:    She has no cervical adenopathy.  Neurological: She is alert and oriented to person, place, and time. She displays normal reflexes. No cranial nerve deficit. She exhibits normal muscle tone. Coordination  normal.  Skin: No rash noted. She is not diaphoretic. No erythema. No pallor.  Psychiatric: She has a normal mood and affect. Her behavior is normal. Judgment and thought content normal.  Vitals reviewed.         Assessment & Plan:  Routine general medical examination at a health care facility  Pure hypercholesterolemia  Essential hypertension Labs are excellent. BP is low.  Quit maxzide and monitor BP.  Cancer screen UTD.  Schedule mammogram.  QUIT SMOKING ENCOURAGED AGAIN!

## 2016-02-10 ENCOUNTER — Ambulatory Visit (INDEPENDENT_AMBULATORY_CARE_PROVIDER_SITE_OTHER): Payer: 59 | Admitting: Internal Medicine

## 2016-02-10 ENCOUNTER — Encounter: Payer: Self-pay | Admitting: Internal Medicine

## 2016-02-10 ENCOUNTER — Ambulatory Visit (INDEPENDENT_AMBULATORY_CARE_PROVIDER_SITE_OTHER)
Admission: RE | Admit: 2016-02-10 | Discharge: 2016-02-10 | Disposition: A | Discharge status: Home / Self Care | Payer: 59 | Source: Ambulatory Visit | Attending: Internal Medicine | Admitting: Internal Medicine

## 2016-02-10 VITALS — BP 138/90 | HR 85 | Temp 98.2°F | Ht 65.5 in | Wt 169.0 lb

## 2016-02-10 DIAGNOSIS — F17210 Nicotine dependence, cigarettes, uncomplicated: Secondary | ICD-10-CM | POA: Diagnosis not present

## 2016-02-10 DIAGNOSIS — R059 Cough, unspecified: Secondary | ICD-10-CM

## 2016-02-10 DIAGNOSIS — R05 Cough: Secondary | ICD-10-CM | POA: Diagnosis not present

## 2016-02-10 MED ORDER — CEFDINIR 300 MG PO CAPS: 300.0000 mg | ORAL_CAPSULE | Freq: Two times a day (BID) | ORAL | 0 refills | Status: DC

## 2016-02-10 MED ORDER — PREDNISONE 10 MG PO TABS: ORAL_TABLET | ORAL | 0 refills | Status: DC

## 2016-02-10 NOTE — Progress Notes (Signed)
Subjective:    Patient ID: Alison [name], female    DOB: 11-07-1955   MRN: [id number]    Brief patient profile:  33 yobf smoker seen by pulmonary  2010 with intermittent asthma and nl pft's   History of Present Illness  05/02/2012 1st pulmonary ov in EPIC era cc great off all meds until 2 weeks prior to OV  Acute onset ear congestion/ popping then one week prior to OV  Noct wheeze,  Changed to darker mucus 5 days prior to OV.  Severe cough to point of can't get her breath, no better with inhalers. rec Pred/zpak/ no smoke   07/15/2013 f/u ov/Alison [name] re:  Never took prednisone / mucinex ? Dm helps some/ still smoking  Chief Complaint  Patient presents with  . Follow-up    Pt states still has prod cough- but sputum is now clear. Her chest tightness has resolved. She never started on pred taper.   last saba 3 d prior to [place]    - Not limited by breathing from desired activities   rec Only use your albuterol (proair)  as a rescue medication  The key is to stop smoking completely before smoking completely stops you!    02/10/2016 acute extended ov/Alison [name] re: baseline no need for albuterol at all  Chief Complaint  Patient presents with  . Acute Visit    Pt c/o nasal congestion, cough with green sputum, HA and increased SOB x 2 wks.    acutely ill 2 weeks prior to OV   sinus congestion/ sore throat, no better with delsym and mucinex so far Breathing better p saba /  Comfortable at rest and able to sleep ok        No obvious day to day or daytime variabilty or assoc  cp or chest tightness, subjective wheeze overt   hb symptoms. No unusual exp hx or h/o childhood pna/ asthma or knowledge of premature birth.  Sleeping ok without nocturnal  or early am exacerbation  of respiratory  c/o's or need for noct saba. Also denies any obvious fluctuation of symptoms with weather or environmental changes or other aggravating or alleviating factors except as outlined above   Current Medications,  Allergies, Complete Past Medical History, Past Surgical History, Family History, and Social History were reviewed in [employer] record.  ROS  The following are not active complaints unless bolded sore throat, dysphagia, dental problems, itching, sneezing,  nasal congestion or excess/ purulent secretions, ear ache,   fever, chills, sweats, unintended wt loss, pleuritic or exertional cp, hemoptysis,  orthopnea pnd or leg swelling, presyncope, palpitations, heartburn, abdominal pain, anorexia, nausea, vomiting, diarrhea  or change in bowel or urinary habits, change in stools or urine, dysuria,hematuria,  rash, arthralgias, visual complaints, headache, numbness weakness or ataxia or problems with walking or coordination,  change in mood/affect or memory.          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 Nl external ear canals without cough reflex   NECK :  without JVD/Nodes/TM/ nl carotid upstrokes bilaterally   LUNGS: no acc muscle use, minimal insp and exp rhonchi bilaterally    CV:  RRR  no s3 or murmur or increase in P2, no edema   ABD:  soft and nontender with nl excursion in the supine position. No bruits or organomegaly, bowel sounds nl  MS:  warm without deformities, calf  tenderness, cyanosis or clubbing  SKIN: warm and dry without lesions         . CXR PA and Lateral:   02/10/2016 :    I personally reviewed images and agree with radiology impression as follows:    COPD-reactive airway disease. Stable interstitial prominence consistent with the smoking history. No pneumonia, CHF, or other acute cardiopulmonary abnormality.       Assessment & Plan:

## 2016-02-10 NOTE — Patient Instructions (Signed)
For cough >  mucinex d up to twice daily as needed  omnicef 300 mg twice daily x 10days  Please remember to go to the  x-ray department downstairs for your tests - we will call you with the results when they are available.  The key is to stop smoking completely before smoking completely stops you!

## 2016-02-11 NOTE — Progress Notes (Signed)
LMTCB

## 2016-02-12 ENCOUNTER — Telehealth: Payer: Self-pay | Admitting: Internal Medicine

## 2016-02-12 NOTE — Telephone Encounter (Signed)
Notes Recorded by Tanda Rockers, MD on 02/11/2016 at 8:32 AM EST Call pt: Reviewed cxr and no acute change so no change in recommendations made at ov   lmomtcb x 1

## 2016-02-15 NOTE — Assessment & Plan Note (Addendum)
Cough > wheeze/ sob c/w acute bronchitis with minimal asthma   rec omnicef x 10 days ( pcn allergy remote, not anaphylactic)/ Prednisone 10 mg take  4 each am x 2 days,   2 each am x 2 days,  1 each am x 2 days and stop   rx mucinex dm/ prn saba  - pulmonary f/u can be prn    I had an extended discussion with the patient reviewing all relevant studies completed to date and  lasting 15 to 20 minutes of a 25 minute visit    Each maintenance medication was reviewed in detail including most importantly the difference between maintenance and prns and under what circumstances the prns are to be triggered using an action plan format that is not reflected in the computer generated alphabetically organized AVS.    Please see instructions for details which were reviewed in writing and the patient given a copy highlighting the part that I personally wrote and discussed at today's ov.

## 2016-02-15 NOTE — Telephone Encounter (Signed)
lmtcb x2 for pt. 

## 2016-02-15 NOTE — Assessment & Plan Note (Signed)
-   PFT's 06/25/12  FEV1 2.22 (90%) ratio 78 with DLCO 70 corrects to 103  > 3 min discussion I reviewed the Fletcher curve with the patient that basically indicates  if you quit smoking when your best day FEV1 is still well preserved (as is clearly  the case here)  it is highly unlikely you will progress to severe disease and informed the patient there was  no medication on the market that has proven to alter the curve/ its downward trajectory  or the likelihood of progression of their disease(unlike other chronic medical conditions such as atheroclerosis where we do think we can change the natural hx with risk reducing meds)    Therefore stopping smoking and maintaining abstinence is the most important aspect of care, not choice of inhalers or for that matter, doctors.

## 2016-02-16 NOTE — Telephone Encounter (Signed)
lmtcb x3 for pt. 

## 2016-02-17 NOTE — Telephone Encounter (Signed)
We have attempted to contact the pt several times with no success or call back. Per triage protocol, message will be closed.  

## 2016-03-04 ENCOUNTER — Telehealth: Payer: Self-pay | Admitting: Internal Medicine

## 2016-03-04 NOTE — Telephone Encounter (Signed)
LMOMTCB x 1 

## 2016-03-04 NOTE — Telephone Encounter (Signed)
LMTCB

## 2016-03-04 NOTE — Telephone Encounter (Signed)
Patient is returning phone call.  °

## 2016-03-07 NOTE — Telephone Encounter (Signed)
LMTCB

## 2016-03-08 NOTE — Telephone Encounter (Signed)
Patient returning phone call. Patient stated to call her at work [phone]/. She will be available until 3:30. This is in reference of her November 15 th test results.

## 2016-03-08 NOTE — Telephone Encounter (Signed)
  Notes Recorded by Tanda Rockers, MD on 02/11/2016 at 8:32 AM EST Call pt: Reviewed cxr and no acute change so no change in recommendations made at [facility] ------------------  Spoke with pt, aware of results/recs.  Nothing further needed.

## 2016-06-27 ENCOUNTER — Other Ambulatory Visit: Payer: Self-pay | Admitting: Family Medicine

## 2016-06-27 DIAGNOSIS — B353 Tinea pedis: Secondary | ICD-10-CM

## 2016-09-22 ENCOUNTER — Ambulatory Visit (INDEPENDENT_AMBULATORY_CARE_PROVIDER_SITE_OTHER): Payer: 59 | Admitting: Podiatry

## 2016-09-22 ENCOUNTER — Encounter: Payer: Self-pay | Admitting: Podiatry

## 2016-09-22 DIAGNOSIS — M21962 Unspecified acquired deformity of left lower leg: Secondary | ICD-10-CM | POA: Diagnosis not present

## 2016-09-22 DIAGNOSIS — M722 Plantar fascial fibromatosis: Secondary | ICD-10-CM | POA: Diagnosis not present

## 2016-09-22 DIAGNOSIS — B353 Tinea pedis: Secondary | ICD-10-CM

## 2016-09-22 NOTE — Patient Instructions (Signed)
Seen for painful left heel. Reviewed clinical findings and available options. Night Splint dispensed. May use antibacterial shampoo scrub for dry scaly skin. Return in 2 weeks with old orthotics.

## 2016-09-22 NOTE — Progress Notes (Signed)
SUBJECTIVE: 61 y.o. year old female presents complaining of pain in left left heel. Positive history of bunion surgery right foot 12/04/14 and still doing well. Now having left foot pain under plantar ball of left heel especially when getting up after been sitting for a while, duration of 6 month.  Objective: Vascular: All pedal pulses are palpable. No edema or erythema noted. Orthopedic: Hypermobile first ray with STJ pronaiton left foot. Dermatologic: Dry peeling skin under right foot. Neurologic: All epicritic and tactile sensations grossly intact.  Assessment: S/P Right bunion surgery without complication. Plantar fasciitis left.  STJ hyperpronation left. Tinea pedis right.  Plan: Reviewed findings and available treatment options. Patient wishes to postpone on injection. Night Splint dispensed. Patient will look for her old orthotics.  Reviewed home care instruction with antibiotic shampoo scrub on tinea pedis right foot. Return in 2 weeks. If old orthotics are not satisfactory, may need a new pair.

## 2016-10-10 ENCOUNTER — Ambulatory Visit: Payer: Self-pay | Admitting: Podiatry

## 2016-12-23 IMAGING — DX DG CHEST 2V
2 series · 2 of 2 positions shown · non-contrast
Comparison: PA and lateral chest x-ray July 19, 2013

CLINICAL DATA: Cough. History of asthma, pneumonia post traumatic
left pneumothorax, current smoker.

EXAM:
CHEST  2 VIEW

[chest pa]
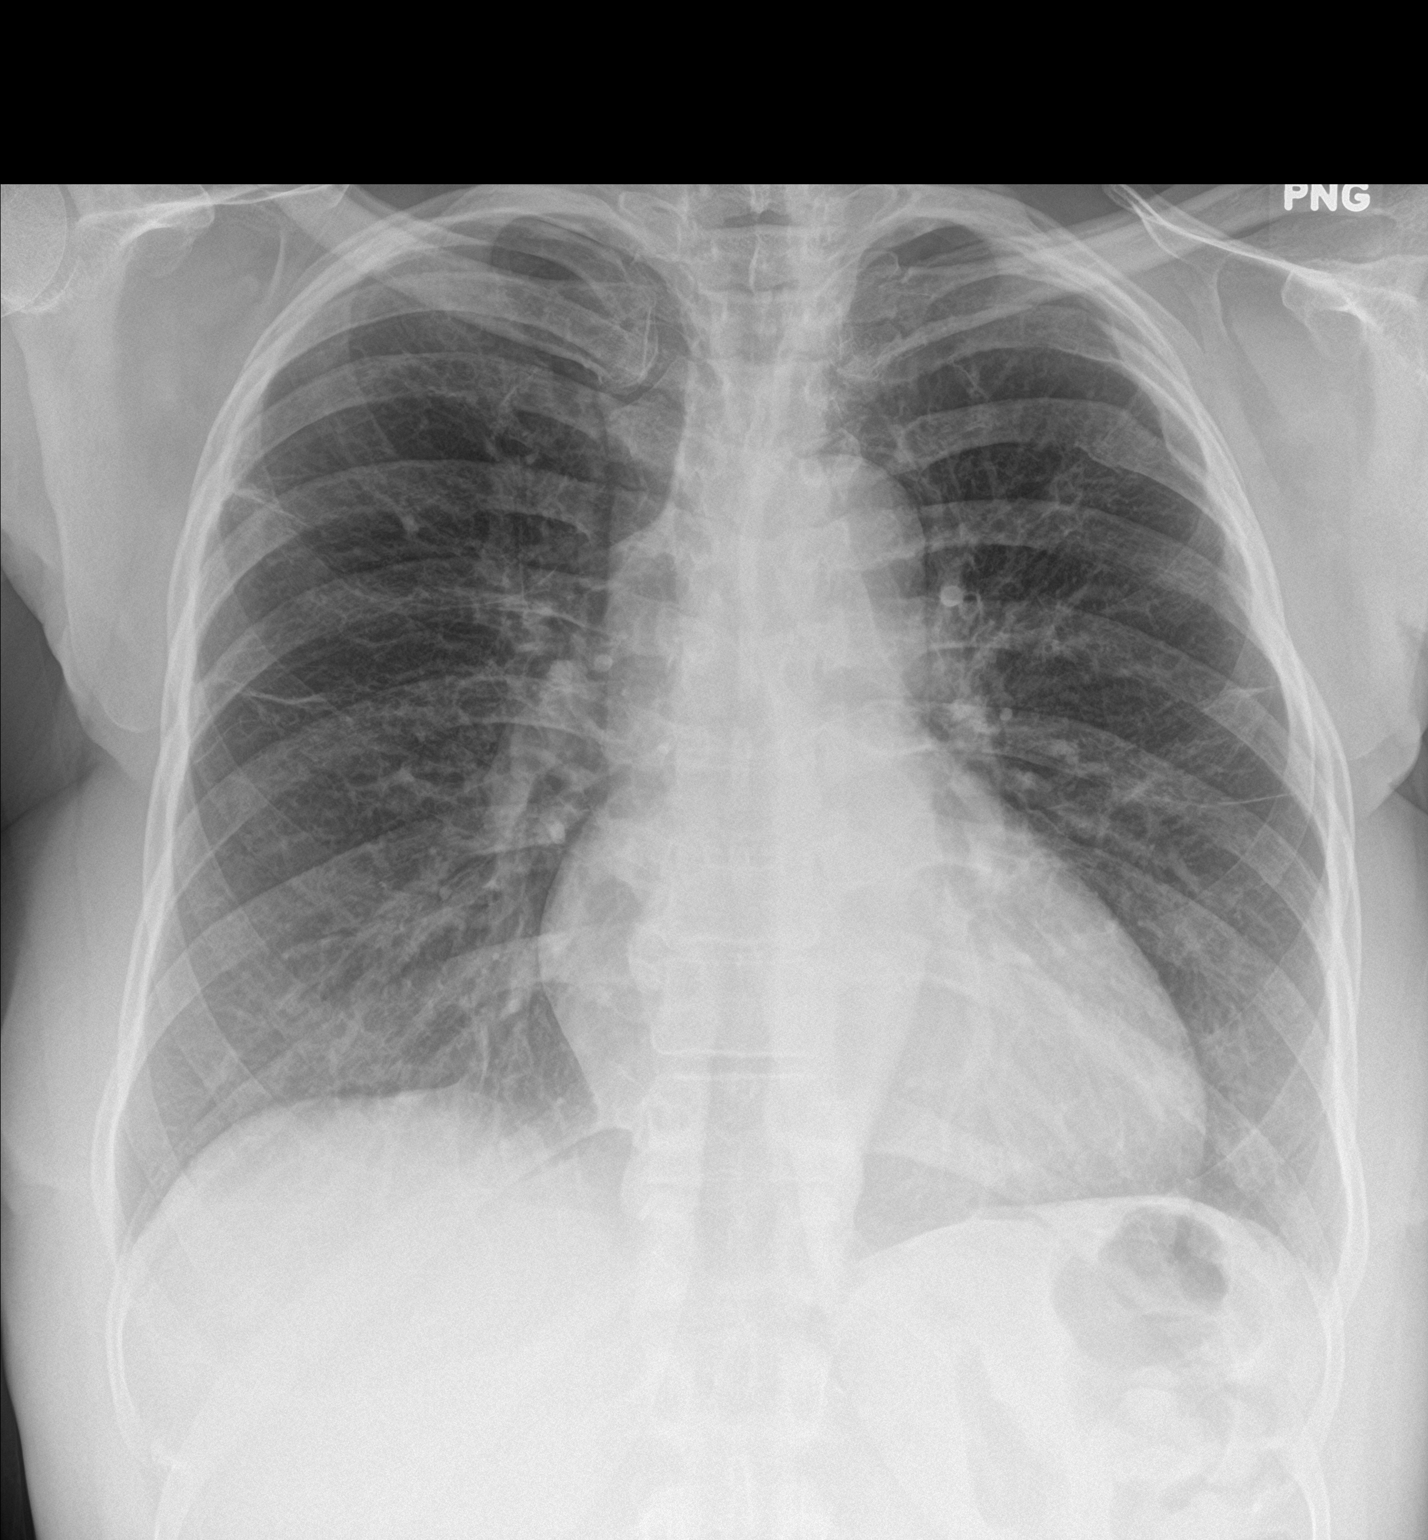

[chest lat]
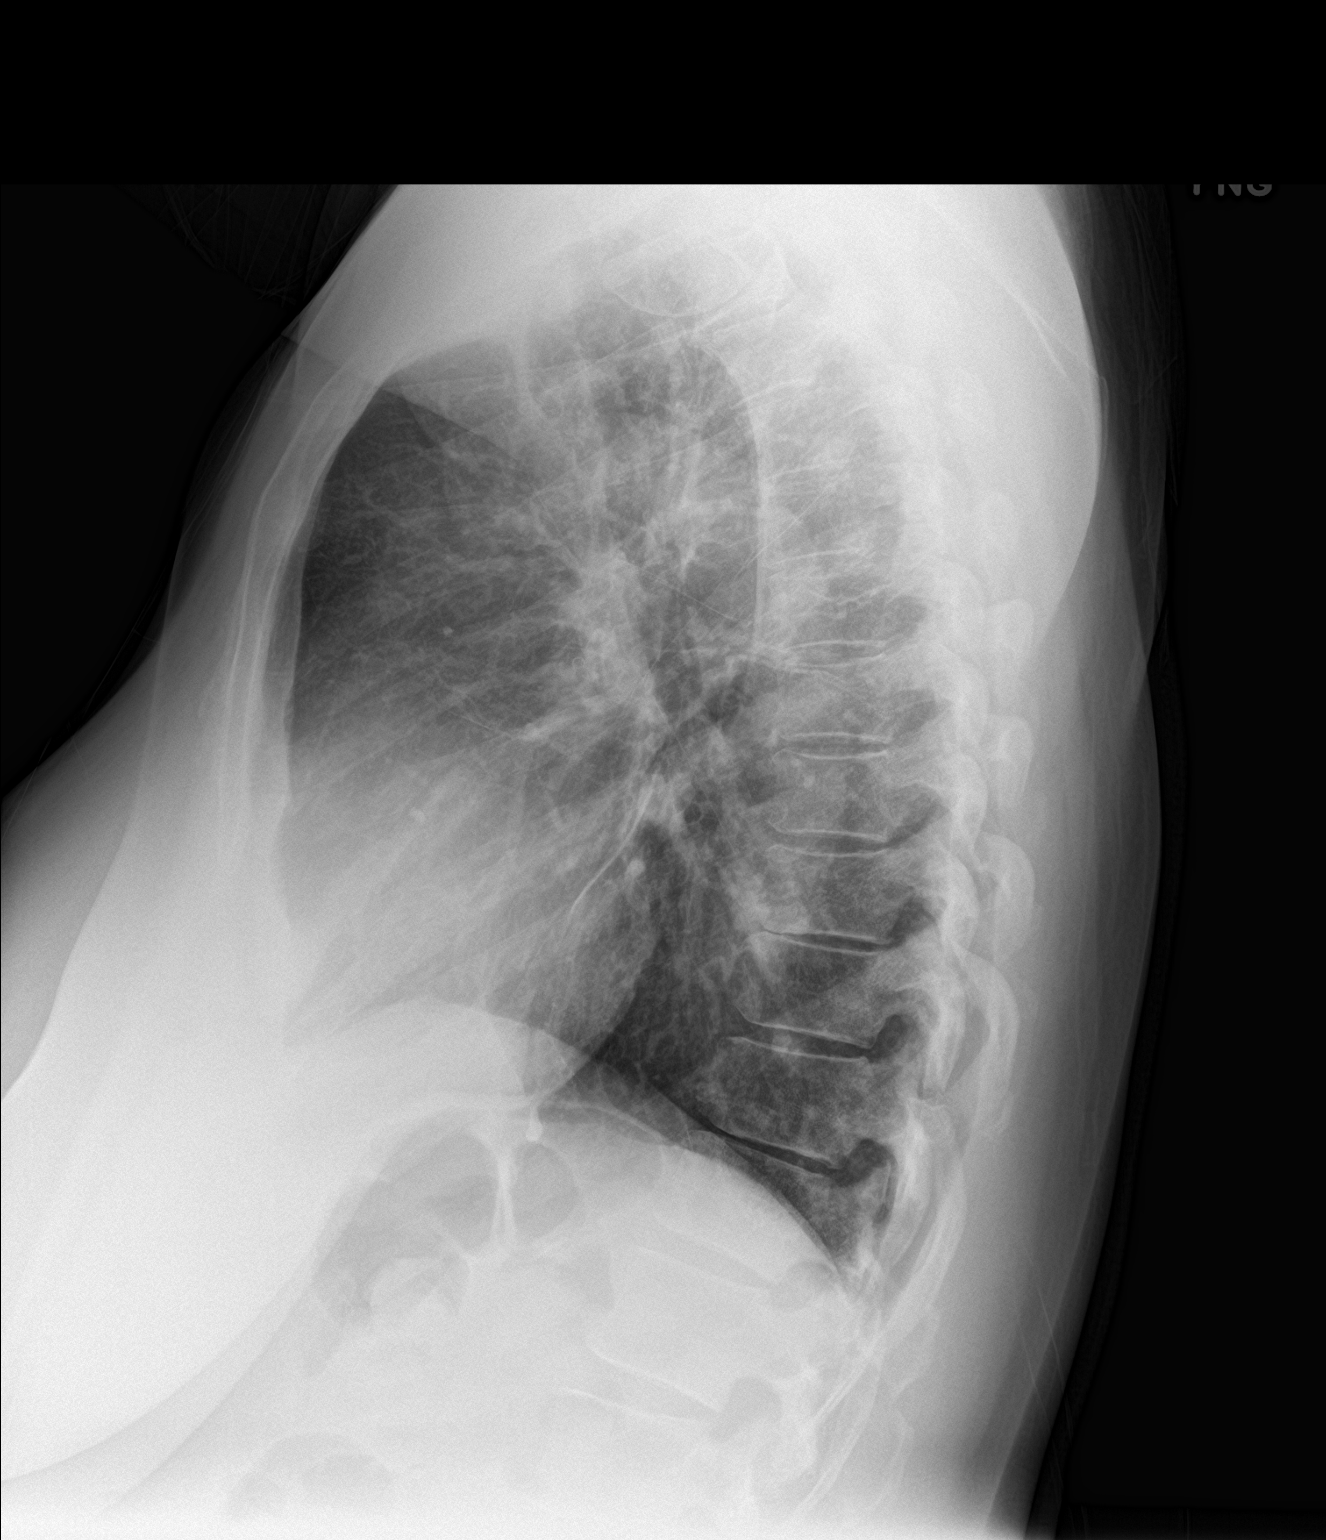

[2 of 2 positions shown; findings below may reference images not displayed]

FINDINGS: The lungs are well-expanded. The interstitial markings are coarse.
There is stable scarring in the periphery of the right upper lobe
and in the left mid lung. There is no alveolar infiltrate or pleural
effusion. The heart is top-normal in size. The pulmonary vascularity
is normal. The mediastinum is normal in width. There is no pleural
effusion. Old deformities of the upper lateral left ribs are stable.
IMPRESSION: COPD-reactive airway disease. Stable interstitial prominence
consistent with the smoking history. No pneumonia, CHF, or other
acute cardiopulmonary abnormality.

## 2017-02-08 ENCOUNTER — Other Ambulatory Visit: Payer: Self-pay

## 2017-02-13 ENCOUNTER — Encounter: Payer: Self-pay | Admitting: Family Medicine

## 2017-02-13 DIAGNOSIS — Z1231 Encounter for screening mammogram for malignant neoplasm of breast: Secondary | ICD-10-CM | POA: Diagnosis not present

## 2017-02-13 DIAGNOSIS — Z01411 Encounter for gynecological examination (general) (routine) with abnormal findings: Secondary | ICD-10-CM | POA: Diagnosis not present

## 2017-03-06 ENCOUNTER — Other Ambulatory Visit: Payer: Self-pay

## 2017-03-09 ENCOUNTER — Other Ambulatory Visit: Payer: Self-pay | Admitting: Family Medicine

## 2017-03-09 DIAGNOSIS — Z0000 Encounter for general adult medical examination without abnormal findings: Secondary | ICD-10-CM

## 2017-03-09 DIAGNOSIS — E559 Vitamin D deficiency, unspecified: Secondary | ICD-10-CM

## 2017-03-09 DIAGNOSIS — E785 Hyperlipidemia, unspecified: Secondary | ICD-10-CM

## 2017-03-09 DIAGNOSIS — Z79899 Other long term (current) drug therapy: Secondary | ICD-10-CM

## 2017-03-10 ENCOUNTER — Other Ambulatory Visit: Payer: 59

## 2017-03-10 DIAGNOSIS — Z0000 Encounter for general adult medical examination without abnormal findings: Secondary | ICD-10-CM

## 2017-03-10 DIAGNOSIS — Z79899 Other long term (current) drug therapy: Secondary | ICD-10-CM

## 2017-03-10 DIAGNOSIS — E785 Hyperlipidemia, unspecified: Secondary | ICD-10-CM

## 2017-03-10 DIAGNOSIS — E559 Vitamin D deficiency, unspecified: Secondary | ICD-10-CM

## 2017-03-11 LAB — CBC WITH DIFFERENTIAL/PLATELET
BASOS PCT: 0.9 %
Basophils Absolute: 59 cells/uL (ref 0–200)
EOS PCT: 4 %
Eosinophils Absolute: 260 cells/uL (ref 15–500)
HCT: 39.3 % (ref 35.0–45.0)
HEMOGLOBIN: 13 g/dL (ref 11.7–15.5)
Lymphs Abs: 3198 cells/uL (ref 850–3900)
MCH: 28.1 pg (ref 27.0–33.0)
MCHC: 33.1 g/dL (ref 32.0–36.0)
MCV: 84.9 fL (ref 80.0–100.0)
MONOS PCT: 8.1 %
MPV: 10.3 fL (ref 7.5–12.5)
NEUTROS ABS: 2457 {cells}/uL (ref 1500–7800)
Neutrophils Relative %: 37.8 %
PLATELETS: 305 10*3/uL (ref 140–400)
RBC: 4.63 10*6/uL (ref 3.80–5.10)
RDW: 12.7 % (ref 11.0–15.0)
Total Lymphocyte: 49.2 %
WBC mixed population: 527 cells/uL (ref 200–950)
WBC: 6.5 10*3/uL (ref 3.8–10.8)

## 2017-03-11 LAB — COMPLETE METABOLIC PANEL WITH GFR
AG Ratio: 1.3 (calc) (ref 1.0–2.5)
ALT: 16 U/L (ref 6–29)
AST: 16 U/L (ref 10–35)
Albumin: 4 g/dL (ref 3.6–5.1)
Alkaline phosphatase (APISO): 64 U/L (ref 33–130)
BILIRUBIN TOTAL: 0.4 mg/dL (ref 0.2–1.2)
BUN: 8 mg/dL (ref 7–25)
CHLORIDE: 106 mmol/L (ref 98–110)
CO2: 30 mmol/L (ref 20–32)
CREATININE: 0.76 mg/dL (ref 0.50–1.05)
Calcium: 9.4 mg/dL (ref 8.6–10.4)
GFR, EST AFRICAN AMERICAN: 100 mL/min/{1.73_m2} (ref >=60)
GFR, Est Non African American: 86 mL/min/{1.73_m2} (ref >=60)
GLUCOSE: 101 mg/dL — AB (ref 65–99)
Globulin: 3 g/dL (calc) (ref 1.9–3.7)
Potassium: 4.4 mmol/L (ref 3.5–5.3)
Sodium: 143 mmol/L (ref 135–146)
TOTAL PROTEIN: 7 g/dL (ref 6.1–8.1)

## 2017-03-11 LAB — LIPID PANEL
CHOL/HDL RATIO: 3.8 (calc) (ref <5.0)
Cholesterol: 209 mg/dL — ABNORMAL HIGH (ref <200)
HDL: 55 mg/dL (ref >50)
LDL Cholesterol (Calc): 134 mg/dL (calc) — ABNORMAL HIGH
NON-HDL CHOLESTEROL (CALC): 154 mg/dL — AB (ref <130)
Triglycerides: 101 mg/dL (ref <150)

## 2017-03-11 LAB — VITAMIN D 25 HYDROXY (VIT D DEFICIENCY, FRACTURES): VIT D 25 HYDROXY: 25 ng/mL — AB (ref 30–100)

## 2017-03-14 ENCOUNTER — Ambulatory Visit (INDEPENDENT_AMBULATORY_CARE_PROVIDER_SITE_OTHER): Payer: 59 | Admitting: Family Medicine

## 2017-03-14 ENCOUNTER — Encounter: Payer: Self-pay | Admitting: Family Medicine

## 2017-03-14 VITALS — BP 146/100 | HR 80 | Temp 98.3°F | Resp 14 | Ht 65.5 in | Wt 171.0 lb

## 2017-03-14 DIAGNOSIS — I10 Essential (primary) hypertension: Secondary | ICD-10-CM

## 2017-03-14 DIAGNOSIS — M545 Low back pain, unspecified: Secondary | ICD-10-CM

## 2017-03-14 DIAGNOSIS — Z0000 Encounter for general adult medical examination without abnormal findings: Secondary | ICD-10-CM

## 2017-03-14 DIAGNOSIS — E7800 Pure hypercholesterolemia, unspecified: Secondary | ICD-10-CM

## 2017-03-14 DIAGNOSIS — E559 Vitamin D deficiency, unspecified: Secondary | ICD-10-CM

## 2017-03-14 NOTE — Progress Notes (Signed)
Subjective:    Patient ID: Alison [name], female    DOB: June 13, 1955, 61 y.o.   MRN: [id number]  Medication Refill     Very pleasant 61 yo AAF here for CPE.  Has pap/pelvic performed at GYN.  Also recently had mammogram at GYN.    Had colonoscopy in 2016 with polyps and due again in 5 years.   Still smoking.  BP is high today wwhich she attributes to stress.  Cholesterol has increased since her last appointment.  She admits that she has gained weight since last year and that her diet has been poor.  She had her flu shot at work.  She is due for the shingles vaccine.  Otherwise she is doing well with no concerns. Appointment on 03/10/2017  Component Date Value Ref Range Status  . Glucose, Bld 03/10/2017 101* 65 - 99 mg/dL Final   Comment: .            Fasting reference interval . For someone without known diabetes, a glucose value between 100 and 125 mg/dL is consistent with prediabetes and should be confirmed with a follow-up test. .   . BUN 03/10/2017 8  7 - 25 mg/dL Final  . Creat 03/10/2017 0.76  0.50 - 1.05 mg/dL Final   Comment: For patients >80 years of age, the reference limit for Creatinine is approximately 13% higher for people identified as African-American. .   . GFR, Est Non African American 03/10/2017 86  > OR = 60 mL/min/1.14m2 Final  . GFR, Est African American 03/10/2017 100  > OR = 60 mL/min/1.40m2 Final  . BUN/Creatinine Ratio 12/87/8676 NOT APPLICABLE  6 - 22 (calc) Final  . Sodium 03/10/2017 143  135 - 146 mmol/L Final  . Potassium 03/10/2017 4.4  3.5 - 5.3 mmol/L Final  . Chloride 03/10/2017 106  98 - 110 mmol/L Final  . CO2 03/10/2017 30  20 - 32 mmol/L Final  . Calcium 03/10/2017 9.4  8.6 - 10.4 mg/dL Final  . Total Protein 03/10/2017 7.0  6.1 - 8.1 g/dL Final  . Albumin 03/10/2017 4.0  3.6 - 5.1 g/dL Final  . Globulin 03/10/2017 3.0  1.9 - 3.7 g/dL (calc) Final  . AG Ratio 03/10/2017 1.3  1.0 - 2.5 (calc) Final  . Total Bilirubin 03/10/2017 0.4   0.2 - 1.2 mg/dL Final  . Alkaline phosphatase (APISO) 03/10/2017 64  33 - 130 U/L Final  . AST 03/10/2017 16  10 - 35 U/L Final  . ALT 03/10/2017 16  6 - 29 U/L Final  . Cholesterol 03/10/2017 209* <200 mg/dL Final  . HDL 03/10/2017 55  >50 mg/dL Final  . Triglycerides 03/10/2017 101  <150 mg/dL Final  . LDL Cholesterol (Calc) 03/10/2017 134* mg/dL (calc) Final   Comment: Reference range: <100 . Desirable range <100 mg/dL for primary prevention;   <70 mg/dL for patients with CHD or diabetic patients  with > or = 2 CHD risk factors. Marland Kitchen LDL-C is now calculated using the Martin-Hopkins  calculation, which is a validated novel method providing  better accuracy than the Friedewald equation in the  estimation of LDL-C.  Cresenciano Genre et al. Annamaria Helling. 7209;470(96): 2061-2068  (http://education.[facility].com/faq/FAQ164)   . Total CHOL/HDL Ratio 03/10/2017 3.8  <5.0 (calc) Final  . Non-HDL Cholesterol (Calc) 03/10/2017 154* <130 mg/dL (calc) Final   Comment: For patients with diabetes plus 1 major ASCVD risk  factor, treating to a non-HDL-C goal of <100 mg/dL  (LDL-C of <70 mg/dL) is considered a  therapeutic  option.   . WBC 03/10/2017 6.5  3.8 - 10.8 Thousand/uL Final  . RBC 03/10/2017 4.63  3.80 - 5.10 Million/uL Final  . Hemoglobin 03/10/2017 13.0  11.7 - 15.5 g/dL Final  . HCT 03/10/2017 39.3  35.0 - 45.0 % Final  . MCV 03/10/2017 84.9  80.0 - 100.0 fL Final  . MCH 03/10/2017 28.1  27.0 - 33.0 pg Final  . MCHC 03/10/2017 33.1  32.0 - 36.0 g/dL Final  . RDW 03/10/2017 12.7  11.0 - 15.0 % Final  . Platelets 03/10/2017 305  140 - 400 Thousand/uL Final  . MPV 03/10/2017 10.3  7.5 - 12.5 fL Final  . Neutro Abs 03/10/2017 2,457  1,500 - 7,800 cells/uL Final  . Lymphs Abs 03/10/2017 3,198  850 - 3,900 cells/uL Final  . WBC mixed population 03/10/2017 527  200 - 950 cells/uL Final  . Eosinophils Absolute 03/10/2017 260  15 - 500 cells/uL Final  . Basophils Absolute 03/10/2017 59  0 - 200  cells/uL Final  . Neutrophils Relative % 03/10/2017 37.8  % Final  . Total Lymphocyte 03/10/2017 49.2  % Final  . Monocytes Relative 03/10/2017 8.1  % Final  . Eosinophils Relative 03/10/2017 4.0  % Final  . Basophils Relative 03/10/2017 0.9  % Final  . Vit D, 25-Hydroxy 03/10/2017 25* 30 - 100 ng/mL Final   Comment: Vitamin D Status         25-OH Vitamin D: . Deficiency:                    <20 ng/mL Insufficiency:             20 - 29 ng/mL Optimal:                 > or = 30 ng/mL . For 25-OH Vitamin D testing on patients on  D2-supplementation and patients for whom quantitation  of D2 and D3 fractions is required, the [facility]ureD(TM) 25-OH VIT D, (D2,D3), LC/MS/MS is recommended: order  code [phone] (patients >2yrs). . For more information on this test, go to: http://education.quest[facility].com/faq/FAQ163 (This link is being provided for  informational/educational purposes only.)    Past Medical History:  Diagnosis Date  . Allergy   . Asthma   . Bone spur 88   Left Knee  . Colon polyps   . Fibroadenoma of breast    Right Breast  . Headache   . Heel spur    Right Foot  . Hypertension   . Obese   . Pneumonia 1990   Past Surgical History:  Procedure Laterality Date  . BUNIONECTOMY Right 12/04/2014   @[facility]   . CESAREAN SECTION    . MASS EXCISION Right 03/16/2015   Procedure: EXCISION OF RIGHT ANTERIOR SHOULDER SOFT TISSUE MASS;  Surgeon: Armandina Gemma, MD;  Location: [facility];  Service: General;  Laterality: Right;  Marland Kitchen VAGINAL HYSTERECTOMY     Fibroids   Current Outpatient Medications on File Prior to Visit  Medication Sig Dispense Refill  . albuterol (PROAIR HFA) 108 (90 Base) MCG/ACT inhaler 2 puffs every 4 hours as needed only  if your can't catch your breath 1 Inhaler 0  . Ascorbic Acid (VITAMIN C PO) Take 1 tablet by mouth daily.    . cholecalciferol (VITAMIN D) 1000 UNITS tablet Take 1 tablet (1,000 Units total) by mouth daily. 90 tablet 3  .  clotrimazole (LOTRIMIN) 1 % cream apply to affected area twice a day 30 g 4  .  vitamin E 400 UNIT capsule Take 400 Units by mouth daily.    Marland Kitchen zolpidem (AMBIEN) 5 MG tablet Take 5 mg by mouth at bedtime as needed. For insomnia     No current facility-administered medications on file prior to visit.    Allergies  Allergen Reactions  . Penicillins Other (See Comments)    REACTION: as child   Social History   Socioeconomic History  . Marital status: Married    Spouse name: Not on file  . Number of children: 1  . Years of education: Not on file  . Highest education level: Not on file  Social Needs  . Financial resource strain: Not on file  . Food insecurity - worry: Not on file  . Food insecurity - inability: Not on file  . Transportation needs - medical: Not on file  . Transportation needs - non-medical: Not on file  Occupational History  . Occupation: Haematologist: [employer]  Tobacco Use  . Smoking status: Current Every Day Smoker    Packs/day: 0.50    Years: 11.00    Pack years: 5.50    Types: Cigarettes  . Smokeless tobacco: Never Used  Substance and Sexual Activity  . Alcohol use: No    Alcohol/week: 0.0 oz  . Drug use: No  . Sexual activity: Yes    Birth control/protection: None  Other Topics Concern  . Not on file  Social History Narrative   Drinks one cup caffeine daily         Family History  Problem Relation Age of Onset  . Heart disease Father        cardiomyopathy (HTN)  . Hypertension Father   . Hypertension Mother   . Hypertension Sister   . Hypertension Brother      Review of Systems  All other systems reviewed and are negative.      Objective:   Physical Exam  Constitutional: She is oriented to person, place, and time. She appears well-developed and well-nourished. No distress.  HENT:  Head: Normocephalic and atraumatic.  Right Ear: External ear normal.  Left Ear: External ear normal.  Nose: Nose  normal.  Mouth/Throat: Oropharynx is clear and moist. No oropharyngeal exudate.  Eyes: Conjunctivae and EOM are normal. Pupils are equal, round, and reactive to light. Right eye exhibits no discharge. Left eye exhibits no discharge. No scleral icterus.  Neck: Normal range of motion. Neck supple. No JVD present. No tracheal deviation present. No thyromegaly present.  Cardiovascular: Normal rate, regular rhythm, normal heart sounds and intact distal pulses. Exam reveals no gallop and no friction rub.  No murmur heard. Pulmonary/Chest: Effort normal and breath sounds normal. No stridor. No respiratory distress. She has no wheezes. She has no rales. She exhibits no tenderness.  Abdominal: Soft. Bowel sounds are normal. She exhibits no distension and no mass. There is no tenderness. There is no rebound and no guarding.  Musculoskeletal: Normal range of motion. She exhibits no edema or tenderness.  Lymphadenopathy:    She has no cervical adenopathy.  Neurological: She is alert and oriented to person, place, and time. She displays normal reflexes. No cranial nerve deficit. She exhibits normal muscle tone. Coordination normal.  Skin: No rash noted. She is not diaphoretic. No erythema. No pallor.  Psychiatric: She has a normal mood and affect. Her behavior is normal. Judgment and thought content normal.  Vitals reviewed.         Assessment & Plan:  Acute  midline low back pain without sciatica - Plan: DG Lumbar Spine Complete  Routine general medical examination at a health care facility  Pure hypercholesterolemia  Essential hypertension  Vitamin D deficiency Continue to encourage smoking cessation.  Flu shot is up-to-date.  Recommended the shingles vaccine.  Mammogram is up-to-date.  Pap smears up-to-date.  Colonoscopy is next due in 2021.  Cholesterol is slightly high.  I have encouraged diet exercise and weight loss to address this.  Blood pressure is elevated today.  Patient refuses blood  pressure medication.  She attributes this to stress.  She would like to check her blood pressure every day for the next 2 weeks and notify me of the values.  If persistently greater than 140/90 I would recommend medication to address blood pressure.  She declines HIV and hepatitis C screening

## 2017-08-07 ENCOUNTER — Encounter: Payer: Self-pay | Admitting: Family Medicine

## 2017-08-07 ENCOUNTER — Ambulatory Visit (INDEPENDENT_AMBULATORY_CARE_PROVIDER_SITE_OTHER): Payer: 59 | Admitting: Family Medicine

## 2017-08-07 VITALS — BP 134/84 | HR 72 | Temp 98.2°F | Resp 16 | Ht 65.5 in | Wt 172.0 lb

## 2017-08-07 DIAGNOSIS — M436 Torticollis: Secondary | ICD-10-CM | POA: Diagnosis not present

## 2017-08-07 DIAGNOSIS — I10 Essential (primary) hypertension: Secondary | ICD-10-CM | POA: Diagnosis not present

## 2017-08-07 MED ORDER — TIZANIDINE HCL 4 MG PO CAPS: 4.0000 mg | ORAL_CAPSULE | Freq: Three times a day (TID) | ORAL | 0 refills | Status: DC | PRN

## 2017-08-07 MED ORDER — TRIAMTERENE-HCTZ 37.5-25 MG PO TABS: 1.0000 | ORAL_TABLET | Freq: Every day | ORAL | 3 refills | Status: DC

## 2017-08-07 NOTE — Progress Notes (Signed)
Subjective:    Patient ID: Alison [name], female    DOB: 1955/08/22, 62 y.o.   MRN: [id number]  HPI  Patient presents today with 3 concerns.  First she is going through a stressful divorce with her ex-husband.  As result her blood pressure has been very high recently.  She is finding her blood pressure to be averaging in the 323F to 573U systolic.  Therefore she independently resumed her Maxide that she previously had been taking for hypertension.  Her blood pressure today is better controlled on the medication.  She denies any chest pain or shortness of breath or dyspnea on exertion however she is about to run a vacation.  Second issue is a lump that she is felt on the posterior aspect of her left lower back just above her gluteus.  In that area is a poorly circumscribed soft tissue mass that appears to be spongy in nature similar to a lipoma.  There is no firm areas or concerning features worrisome for malignancy.  Third issue is stiffness and tightness in her neck at night.  She reports pain and stiffness in her trapezius muscle.  On palpation today, she has muscle spasms.  She believes this is due to stress in the way she is holding her head at work.  She denies any numbness or tingling in her arms or hands. Past Medical History:  Diagnosis Date  . Allergy   . Asthma   . Bone spur 87   Left Knee  . Colon polyps   . Fibroadenoma of breast    Right Breast  . Headache   . Heel spur    Right Foot  . Hypertension   . Obese   . Pneumonia 1990   Past Surgical History:  Procedure Laterality Date  . BUNIONECTOMY Right 12/04/2014   @[facility]   . CESAREAN SECTION    . MASS EXCISION Right 03/16/2015   Procedure: EXCISION OF RIGHT ANTERIOR SHOULDER SOFT TISSUE MASS;  Surgeon: Armandina Gemma, MD;  Location: [facility];  Service: General;  Laterality: Right;  Marland Kitchen VAGINAL HYSTERECTOMY     Fibroids   Current Outpatient Medications on File Prior to Visit  Medication Sig Dispense Refill    . albuterol (PROAIR HFA) 108 (90 Base) MCG/ACT inhaler 2 puffs every 4 hours as needed only  if your can't catch your breath 1 Inhaler 0  . cholecalciferol (VITAMIN D) 1000 UNITS tablet Take 1 tablet (1,000 Units total) by mouth daily. 90 tablet 3  . clotrimazole (LOTRIMIN) 1 % cream apply to affected area twice a day 30 g 4  . vitamin E 400 UNIT capsule Take 400 Units by mouth daily.    Marland Kitchen zolpidem (AMBIEN) 5 MG tablet Take 5 mg by mouth at bedtime as needed. For insomnia     No current facility-administered medications on file prior to visit.    Allergies  Allergen Reactions  . Penicillins Other (See Comments)    REACTION: as child   Social History   Socioeconomic History  . Marital status: Married    Spouse name: Not on file  . Number of children: 1  . Years of education: Not on file  . Highest education level: Not on file  Occupational History  . Occupation: Haematologist: [employer]  . Financial resource strain: Not on file  . Food insecurity:    Worry: Not on file    Inability: Not on file  . Transportation  needs:    Medical: Not on file    Non-medical: Not on file  Tobacco Use  . Smoking status: Current Every Day Smoker    Packs/day: 0.50    Years: 11.00    Pack years: 5.50    Types: Cigarettes  . Smokeless tobacco: Never Used  Substance and Sexual Activity  . Alcohol use: No    Alcohol/week: 0.0 oz  . Drug use: No  . Sexual activity: Yes    Birth control/protection: None  Lifestyle  . Physical activity:    Days per week: Not on file    Minutes per session: Not on file  . Stress: Not on file  Relationships  . Social connections:    Talks on phone: Not on file    Gets together: Not on file    Attends religious service: Not on file    Active member of club or organization: Not on file    Attends meetings of clubs or organizations: Not on file    Relationship status: Not on file  . Intimate partner violence:     Fear of current or ex partner: Not on file    Emotionally abused: Not on file    Physically abused: Not on file    Forced sexual activity: Not on file  Other Topics Concern  . Not on file  Social History Narrative   Drinks one cup caffeine daily           Review of Systems  All other systems reviewed and are negative.      Objective:   Physical Exam  Constitutional: She appears well-developed and well-nourished.  Cardiovascular: Normal rate, regular rhythm and normal heart sounds. Exam reveals no gallop and no friction rub.  No murmur heard. Pulmonary/Chest: Effort normal and breath sounds normal. No stridor. No respiratory distress. She has no wheezes.  Musculoskeletal:       Cervical back: She exhibits pain and spasm. She exhibits normal range of motion.       Back:  Vitals reviewed.         Assessment & Plan:  Essential hypertension  Neck stiffness  Continue Maxide 37.5/25 1 p.o. daily.  Recheck blood pressure in 1 month.  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  Patient declines surgical referral for excision.  Monitor clinically for any changes that would warrant a biopsy.  I believe the neck stiffness is due to stress and cervical paraspinal muscle spasms.  She can use tizanidine 4 mg every 8 hours as needed for muscle spasms.  Meanwhile I recommended either yoga or Pilates or stretches to manage and prevent.

## 2017-09-04 ENCOUNTER — Other Ambulatory Visit: Payer: Self-pay | Admitting: Family Medicine

## 2017-09-04 DIAGNOSIS — B353 Tinea pedis: Secondary | ICD-10-CM

## 2017-10-15 ENCOUNTER — Other Ambulatory Visit: Payer: Self-pay | Admitting: Family Medicine

## 2017-10-15 DIAGNOSIS — B353 Tinea pedis: Secondary | ICD-10-CM

## 2017-11-23 ENCOUNTER — Other Ambulatory Visit: Payer: Self-pay | Admitting: Family Medicine

## 2017-11-23 DIAGNOSIS — B353 Tinea pedis: Secondary | ICD-10-CM

## 2018-01-03 ENCOUNTER — Other Ambulatory Visit: Payer: Self-pay | Admitting: Family Medicine

## 2018-01-03 DIAGNOSIS — B353 Tinea pedis: Secondary | ICD-10-CM

## 2018-03-06 DIAGNOSIS — Z6827 Body mass index (BMI) 27.0-27.9, adult: Secondary | ICD-10-CM | POA: Diagnosis not present

## 2018-03-06 DIAGNOSIS — Z01411 Encounter for gynecological examination (general) (routine) with abnormal findings: Secondary | ICD-10-CM | POA: Diagnosis not present

## 2018-03-06 DIAGNOSIS — Z1231 Encounter for screening mammogram for malignant neoplasm of breast: Secondary | ICD-10-CM | POA: Diagnosis not present

## 2018-03-06 LAB — HM MAMMOGRAPHY

## 2018-03-16 ENCOUNTER — Other Ambulatory Visit: Payer: Self-pay | Admitting: Family Medicine

## 2018-03-16 ENCOUNTER — Other Ambulatory Visit: Payer: 59

## 2018-03-16 DIAGNOSIS — I10 Essential (primary) hypertension: Secondary | ICD-10-CM | POA: Diagnosis not present

## 2018-03-16 DIAGNOSIS — Z0000 Encounter for general adult medical examination without abnormal findings: Secondary | ICD-10-CM

## 2018-03-16 DIAGNOSIS — E785 Hyperlipidemia, unspecified: Secondary | ICD-10-CM | POA: Diagnosis not present

## 2018-03-16 DIAGNOSIS — Z79899 Other long term (current) drug therapy: Secondary | ICD-10-CM | POA: Diagnosis not present

## 2018-03-16 DIAGNOSIS — B353 Tinea pedis: Secondary | ICD-10-CM

## 2018-03-16 DIAGNOSIS — E559 Vitamin D deficiency, unspecified: Secondary | ICD-10-CM

## 2018-03-17 LAB — CBC WITH DIFFERENTIAL/PLATELET
Absolute Monocytes: 529 cells/uL (ref 200–950)
Basophils Absolute: 47 cells/uL (ref 0–200)
Basophils Relative: 0.7 %
Eosinophils Absolute: 221 cells/uL (ref 15–500)
Eosinophils Relative: 3.3 %
HCT: 38.1 % (ref 35.0–45.0)
Hemoglobin: 13.1 g/dL (ref 11.7–15.5)
Lymphs Abs: 2767 cells/uL (ref 850–3900)
MCH: 29 pg (ref 27.0–33.0)
MCHC: 34.4 g/dL (ref 32.0–36.0)
MCV: 84.3 fL (ref 80.0–100.0)
MPV: 11.1 fL (ref 7.5–12.5)
Monocytes Relative: 7.9 %
Neutro Abs: 3136 cells/uL (ref 1500–7800)
Neutrophils Relative %: 46.8 %
Platelets: 351 10*3/uL (ref 140–400)
RBC: 4.52 10*6/uL (ref 3.80–5.10)
RDW: 12.8 % (ref 11.0–15.0)
Total Lymphocyte: 41.3 %
WBC: 6.7 10*3/uL (ref 3.8–10.8)

## 2018-03-17 LAB — COMPREHENSIVE METABOLIC PANEL
AG RATIO: 1.5 (calc) (ref 1.0–2.5)
ALT: 15 U/L (ref 6–29)
AST: 19 U/L (ref 10–35)
Albumin: 4.5 g/dL (ref 3.6–5.1)
Alkaline phosphatase (APISO): 53 U/L (ref 33–130)
BILIRUBIN TOTAL: 0.6 mg/dL (ref 0.2–1.2)
BUN: 17 mg/dL (ref 7–25)
CALCIUM: 10 mg/dL (ref 8.6–10.4)
CHLORIDE: 103 mmol/L (ref 98–110)
CO2: 28 mmol/L (ref 20–32)
Creat: 0.92 mg/dL (ref 0.50–0.99)
Globulin: 3.1 g/dL (calc) (ref 1.9–3.7)
Glucose, Bld: 97 mg/dL (ref 65–99)
Potassium: 4.3 mmol/L (ref 3.5–5.3)
Sodium: 142 mmol/L (ref 135–146)
Total Protein: 7.6 g/dL (ref 6.1–8.1)

## 2018-03-17 LAB — LIPID PANEL
Cholesterol: 195 mg/dL (ref <200)
HDL: 49 mg/dL — ABNORMAL LOW (ref >50)
LDL Cholesterol (Calc): 126 mg/dL (calc) — ABNORMAL HIGH
Non-HDL Cholesterol (Calc): 146 mg/dL (calc) — ABNORMAL HIGH (ref <130)
Total CHOL/HDL Ratio: 4 (calc) (ref <5.0)
Triglycerides: 95 mg/dL (ref <150)

## 2018-03-17 LAB — VITAMIN D 25 HYDROXY (VIT D DEFICIENCY, FRACTURES): Vit D, 25-Hydroxy: 56 ng/mL (ref 30–100)

## 2018-03-17 LAB — TSH: TSH: 0.24 mIU/L — ABNORMAL LOW (ref 0.40–4.50)

## 2018-03-19 ENCOUNTER — Ambulatory Visit (INDEPENDENT_AMBULATORY_CARE_PROVIDER_SITE_OTHER): Payer: 59 | Admitting: Family Medicine

## 2018-03-19 ENCOUNTER — Encounter: Payer: Self-pay | Admitting: Family Medicine

## 2018-03-19 VITALS — BP 130/90 | HR 90 | Temp 98.3°F | Resp 14 | Ht 65.5 in | Wt 174.0 lb

## 2018-03-19 DIAGNOSIS — I10 Essential (primary) hypertension: Secondary | ICD-10-CM | POA: Diagnosis not present

## 2018-03-19 DIAGNOSIS — E559 Vitamin D deficiency, unspecified: Secondary | ICD-10-CM

## 2018-03-19 DIAGNOSIS — F17200 Nicotine dependence, unspecified, uncomplicated: Secondary | ICD-10-CM

## 2018-03-19 DIAGNOSIS — Z0000 Encounter for general adult medical examination without abnormal findings: Secondary | ICD-10-CM

## 2018-03-19 DIAGNOSIS — E7800 Pure hypercholesterolemia, unspecified: Secondary | ICD-10-CM | POA: Diagnosis not present

## 2018-03-19 MED ORDER — TRIAMTERENE-HCTZ 37.5-25 MG PO TABS: 1.0000 | ORAL_TABLET | Freq: Every day | ORAL | 3 refills | Status: DC

## 2018-03-19 NOTE — Progress Notes (Signed)
Subjective:    Patient ID: Alison [name], female    DOB: 1955-05-10, 62 y.o.   MRN: [id number]  Patient was last seen in May.  At that time, I recommended Maxide for blood pressure control.  She was taking 37.5/25 1 p.o. daily.  She is here today for complete physical exam.   Patient gets her mammogram at her gynecologist office.  She has had one this year.  She had a colonoscopy approximately 5 years ago with Dr. Collene Mares.  I have no record of these however she will sign a release of information form so that I can get them to update her records.  She has not had her flu shot.  Patient politely declines her flu shot.  She is due for the shingles vaccine which we discussed at length today.  Her most recent lab work is listed below.  I calculated her 10-year risk of cardiovascular disease to be 14.2% because of her smoking and her high blood pressure.  With a statin, we can reduce this to 9.9%.  If she were to quit smoking we could reduce it to 5%.  Patient declines a statin but would like to work on smoking cessation.  Lab on 03/16/2018  Component Date Value Ref Range Status  . Glucose, Bld 03/16/2018 97  65 - 99 mg/dL Final   Comment: .            Fasting reference interval .   . BUN 03/16/2018 17  7 - 25 mg/dL Final  . Creat 03/16/2018 0.92  0.50 - 0.99 mg/dL Final   Comment: For patients >36 years of age, the reference limit for Creatinine is approximately 13% higher for people identified as African-American. .   Havery Moros Ratio 62/83/1517 NOT APPLICABLE  6 - 22 (calc) Final  . Sodium 03/16/2018 142  135 - 146 mmol/L Final  . Potassium 03/16/2018 4.3  3.5 - 5.3 mmol/L Final  . Chloride 03/16/2018 103  98 - 110 mmol/L Final  . CO2 03/16/2018 28  20 - 32 mmol/L Final  . Calcium 03/16/2018 10.0  8.6 - 10.4 mg/dL Final  . Total Protein 03/16/2018 7.6  6.1 - 8.1 g/dL Final  . Albumin 03/16/2018 4.5  3.6 - 5.1 g/dL Final  . Globulin 03/16/2018 3.1  1.9 - 3.7 g/dL (calc) Final  . AG  Ratio 03/16/2018 1.5  1.0 - 2.5 (calc) Final  . Total Bilirubin 03/16/2018 0.6  0.2 - 1.2 mg/dL Final  . Alkaline phosphatase (APISO) 03/16/2018 53  33 - 130 U/L Final  . AST 03/16/2018 19  10 - 35 U/L Final  . ALT 03/16/2018 15  6 - 29 U/L Final  . WBC 03/16/2018 6.7  3.8 - 10.8 Thousand/uL Final  . RBC 03/16/2018 4.52  3.80 - 5.10 Million/uL Final  . Hemoglobin 03/16/2018 13.1  11.7 - 15.5 g/dL Final  . HCT 03/16/2018 38.1  35.0 - 45.0 % Final  . MCV 03/16/2018 84.3  80.0 - 100.0 fL Final  . MCH 03/16/2018 29.0  27.0 - 33.0 pg Final  . MCHC 03/16/2018 34.4  32.0 - 36.0 g/dL Final  . RDW 03/16/2018 12.8  11.0 - 15.0 % Final  . Platelets 03/16/2018 351  140 - 400 Thousand/uL Final  . MPV 03/16/2018 11.1  7.5 - 12.5 fL Final  . Neutro Abs 03/16/2018 3,136  1,500 - 7,800 cells/uL Final  . Lymphs Abs 03/16/2018 2,767  850 - 3,900 cells/uL Final  . Absolute Monocytes 03/16/2018 529  200 - 950 cells/uL Final  . Eosinophils Absolute 03/16/2018 221  15 - 500 cells/uL Final  . Basophils Absolute 03/16/2018 47  0 - 200 cells/uL Final  . Neutrophils Relative % 03/16/2018 46.8  % Final  . Total Lymphocyte 03/16/2018 41.3  % Final  . Monocytes Relative 03/16/2018 7.9  % Final  . Eosinophils Relative 03/16/2018 3.3  % Final  . Basophils Relative 03/16/2018 0.7  % Final  . Cholesterol 03/16/2018 195  <200 mg/dL Final  . HDL 03/16/2018 49* >50 mg/dL Final  . Triglycerides 03/16/2018 95  <150 mg/dL Final  . LDL Cholesterol (Calc) 03/16/2018 126* mg/dL (calc) Final   Comment: Reference range: <100 . Desirable range <100 mg/dL for primary prevention;   <70 mg/dL for patients with CHD or diabetic patients  with > or = 2 CHD risk factors. Marland Kitchen LDL-C is now calculated using the Martin-Hopkins  calculation, which is a validated novel method providing  better accuracy than the Friedewald equation in the  estimation of LDL-C.  Cresenciano Genre et al. Annamaria Helling. 5956;387(56): 2061-2068    (http://education.[facility].com/faq/FAQ164)   . Total CHOL/HDL Ratio 03/16/2018 4.0  <5.0 (calc) Final  . Non-HDL Cholesterol (Calc) 03/16/2018 146* <130 mg/dL (calc) Final   Comment: For patients with diabetes plus 1 major ASCVD risk  factor, treating to a non-HDL-C goal of <100 mg/dL  (LDL-C of <70 mg/dL) is considered a therapeutic  option.   Marland Kitchen TSH 03/16/2018 0.24* 0.40 - 4.50 mIU/L Final  . Vit D, 25-Hydroxy 03/16/2018 56  30 - 100 ng/mL Final   Comment: Vitamin D Status         25-OH Vitamin D: . Deficiency:                    <20 ng/mL Insufficiency:             20 - 29 ng/mL Optimal:                 > or = 30 ng/mL . For 25-OH Vitamin D testing on patients on  D2-supplementation and patients for whom quantitation  of D2 and D3 fractions is required, the QuestAssureD(TM) 25-OH VIT D, (D2,D3), LC/MS/MS is recommended: order  code 450 [id number] [id number] (patients >34yrs). . For more information on this test, go to: http://education.[facility].com/faq/FAQ163 (This link is being provided for  informational/educational purposes only.)    Past Medical History:  Diagnosis Date  . Allergy   . Asthma   . Bone spur 88   Left Knee  . Colon polyps   . Fibroadenoma of breast    Right Breast  . Headache   . Heel spur    Right Foot  . Hypertension   . Obese   . Pneumonia 1990   Past Surgical History:  Procedure Laterality Date  . BUNIONECTOMY Right 12/04/2014   @[facility]   . CESAREAN SECTION    . MASS EXCISION Right 03/16/2015   Procedure: EXCISION OF RIGHT ANTERIOR SHOULDER SOFT TISSUE MASS;  Surgeon: Armandina Gemma, MD;  Location: [facility];  Service: General;  Laterality: Right;  Marland Kitchen VAGINAL HYSTERECTOMY     Fibroids   Current Outpatient Medications on File Prior to Visit  Medication Sig Dispense Refill  . albuterol (PROAIR HFA) 108 (90 Base) MCG/ACT inhaler 2 puffs every 4 hours as needed only  if your can't catch your breath 1 Inhaler 0  . cholecalciferol  (VITAMIN D) 1000 UNITS tablet Take 1 tablet (1,000 Units total) by mouth daily. 90 tablet  3  . clotrimazole (LOTRIMIN) 1 % cream APPLY TO AFFECTED AREA TWICE A DAY 30 g 0  . tiZANidine (ZANAFLEX) 4 MG capsule Take 1 capsule (4 mg total) by mouth 3 (three) times daily as needed for muscle spasms. 90 capsule 0  . triamterene-hydrochlorothiazide (MAXZIDE-25) 37.5-25 MG tablet Take 1 tablet by mouth daily. 90 tablet 3  . vitamin E 400 UNIT capsule Take 400 Units by mouth daily.    Marland Kitchen zolpidem (AMBIEN) 5 MG tablet Take 5 mg by mouth at bedtime as needed. For insomnia     No current facility-administered medications on file prior to visit.    Allergies  Allergen Reactions  . Penicillins Other (See Comments)    REACTION: as child   Social History   Socioeconomic History  . Marital status: Married    Spouse name: Not on file  . Number of children: 1  . Years of education: Not on file  . Highest education level: Not on file  Occupational History  . Occupation: Haematologist: [employer]  . Financial resource strain: Not on file  . Food insecurity:    Worry: Not on file    Inability: Not on file  . Transportation needs:    Medical: Not on file    Non-medical: Not on file  Tobacco Use  . Smoking status: Current Every Day Smoker    Packs/day: 0.50    Years: 11.00    Pack years: 5.50    Types: Cigarettes  . Smokeless tobacco: Never Used  Substance and Sexual Activity  . Alcohol use: No    Alcohol/week: 0.0 standard drinks  . Drug use: No  . Sexual activity: Yes    Birth control/protection: None  Lifestyle  . Physical activity:    Days per week: Not on file    Minutes per session: Not on file  . Stress: Not on file  Relationships  . Social connections:    Talks on phone: Not on file    Gets together: Not on file    Attends religious service: Not on file    Active member of club or organization: Not on file    Attends meetings of  clubs or organizations: Not on file    Relationship status: Not on file  . Intimate partner violence:    Fear of current or ex partner: Not on file    Emotionally abused: Not on file    Physically abused: Not on file    Forced sexual activity: Not on file  Other Topics Concern  . Not on file  Social History Narrative   Drinks one cup caffeine daily         Family History  Problem Relation Age of Onset  . Heart disease Father        cardiomyopathy (HTN)  . Hypertension Father   . Hypertension Mother   . Hypertension Sister   . Hypertension Brother      Review of Systems  All other systems reviewed and are negative.      Objective:   Physical Exam  Constitutional: She is oriented to person, place, and time. She appears well-developed and well-nourished. No distress.  HENT:  Head: Normocephalic and atraumatic.  Right Ear: External ear normal.  Left Ear: External ear normal.  Nose: Nose normal.  Mouth/Throat: Oropharynx is clear and moist. No oropharyngeal exudate.  Eyes: Pupils are equal, round, and reactive to light. Conjunctivae and EOM are normal. Right  eye exhibits no discharge. Left eye exhibits no discharge. No scleral icterus.  Neck: Normal range of motion. Neck supple. No JVD present. No tracheal deviation present. No thyromegaly present.  Cardiovascular: Normal rate, regular rhythm, normal heart sounds and intact distal pulses. Exam reveals no gallop and no friction rub.  No murmur heard. Pulmonary/Chest: Effort normal and breath sounds normal. No stridor. No respiratory distress. She has no wheezes. She has no rales. She exhibits no tenderness.  Abdominal: Soft. Bowel sounds are normal. She exhibits no distension and no mass. There is no abdominal tenderness. There is no rebound and no guarding.  Musculoskeletal: Normal range of motion.        General: No tenderness or edema.  Lymphadenopathy:    She has no cervical adenopathy.  Neurological: She is alert and  oriented to person, place, and time. She displays normal reflexes. No cranial nerve deficit. She exhibits normal muscle tone. Coordination normal.  Skin: No rash noted. She is not diaphoretic. No erythema. No pallor.  Psychiatric: She has a normal mood and affect. Her behavior is normal. Judgment and thought content normal.  Vitals reviewed.         Assessment & Plan:  Routine general medical examination at a health care facility  Essential hypertension  Pure hypercholesterolemia  Vitamin D deficiency  Smoker  Strongly encourage smoking cessation.  Patient states that she would like to work on this prior to starting any statin.  Blood pressure today is borderline however she has been out of her medication for a few days.  On her medication, she states that her blood pressure is 120-130/70-80 and well controlled.  She declines her flu shot.  She will consider the shingles vaccine.  I will have her sign a release of information form so that I can get her mammogram from her gynecologist and get her colonoscopy from Dr. Collene Mares.  Patient has a history of a hysterectomy and therefore does not require Pap smears.  I did recommend the shingles vaccine.

## 2018-03-29 ENCOUNTER — Encounter: Payer: Self-pay | Admitting: *Deleted

## 2018-04-14 ENCOUNTER — Encounter: Payer: Self-pay | Admitting: *Deleted

## 2018-08-27 ENCOUNTER — Other Ambulatory Visit: Payer: Self-pay | Admitting: Family Medicine

## 2019-03-25 ENCOUNTER — Ambulatory Visit (INDEPENDENT_AMBULATORY_CARE_PROVIDER_SITE_OTHER): Payer: No Typology Code available for payment source | Admitting: Family Medicine

## 2019-03-25 ENCOUNTER — Other Ambulatory Visit: Payer: Self-pay

## 2019-03-25 ENCOUNTER — Encounter: Payer: Self-pay | Admitting: Family Medicine

## 2019-03-25 VITALS — BP 120/78 | HR 86 | Temp 97.6°F | Resp 15 | Ht 65.0 in | Wt 179.0 lb

## 2019-03-25 DIAGNOSIS — Z0000 Encounter for general adult medical examination without abnormal findings: Secondary | ICD-10-CM

## 2019-03-25 MED ORDER — MELOXICAM 15 MG PO TABS: 15.0000 mg | ORAL_TABLET | Freq: Every day | ORAL | 1 refills | Status: DC

## 2019-03-25 NOTE — Progress Notes (Signed)
Subjective:    Patient ID: Alison [name], female    DOB: 09-24-1955, 63 y.o.   MRN: [id number]  Patient is a very pleasant 63 year old African-American female here today for complete physical exam.  She does complain of some pain in her left shoulder.  An MRI in 2013 revealed possible partial labral tear as well as an unfavorable acromion.  She does have symptoms consistent with impingement syndrome particularly pain with abduction greater than 90 degrees.  She is not taking any type of NSAIDs.  Other than that she has been doing well.  She is due for her mammogram but she schedules this with her gynecologist.  She does not require Pap smear as she has a history of a partial hysterectomy.  Her last colonoscopy was in 2016.  They did find 2 hyperplastic polyps but biopsies were negative.  She is due again in 2026.  She is due for a flu shot which she politely declines.  She is also due for the shingles shot.  She is not taking any calcium or vitamin D.  She declines hepatitis C and HIV screening today. No visits with results within 1 Month(s) from this visit.  Latest known visit with results is:  Abstract on 04/14/2018  Component Date Value Ref Range Status  . HM Mammogram 03/06/2018 0-4 [facility]  0-4 [facility], Self Reported Normal Final   2- BENIGN FINDINGS   Past Medical History:  Diagnosis Date  . Allergy   . Asthma   . Bone spur 63   Left Knee  . Colon polyps   . Fibroadenoma of breast    Right Breast  . Headache   . Heel spur    Right Foot  . Hypertension   . Obese   . Pneumonia 1990   Past Surgical History:  Procedure Laterality Date  . BUNIONECTOMY Right 12/04/2014   @[facility]   . CESAREAN SECTION    . MASS EXCISION Right 03/16/2015   Procedure: EXCISION OF RIGHT ANTERIOR SHOULDER SOFT TISSUE MASS;  Surgeon: Armandina Gemma, MD;  Location: [facility];  Service: General;  Laterality: Right;  Marland Kitchen VAGINAL HYSTERECTOMY     Fibroids   Current Outpatient Medications on File  Prior to Visit  Medication Sig Dispense Refill  . tiZANidine (ZANAFLEX) 4 MG capsule Take 1 capsule (4 mg total) by mouth 3 (three) times daily as needed for muscle spasms. 90 capsule 0  . triamterene-hydrochlorothiazide (MAXZIDE-25) 37.5-25 MG tablet TAKE 1 TABLET BY MOUTH DAILY 90 tablet 3  . vitamin E 400 UNIT capsule Take 400 Units by mouth daily.    Marland Kitchen albuterol (PROAIR HFA) 108 (90 Base) MCG/ACT inhaler 2 puffs every 4 hours as needed only  if your can't catch your breath (Patient not taking: Reported on 03/25/2019) 1 Inhaler 0  . cholecalciferol (VITAMIN D) 1000 UNITS tablet Take 1 tablet (1,000 Units total) by mouth daily. (Patient not taking: Reported on 03/25/2019) 90 tablet 3  . zolpidem (AMBIEN) 5 MG tablet Take 5 mg by mouth at bedtime as needed. For insomnia     No current facility-administered medications on file prior to visit.   Allergies  Allergen Reactions  . Penicillins Other (See Comments)    REACTION: as child   Social History   Socioeconomic History  . Marital status: Married    Spouse name: Not on file  . Number of children: 1  . Years of education: Not on file  . Highest education level: Not on file  Occupational History  .  Occupation: Haematologist: [employer]  Tobacco Use  . Smoking status: Current Every Day Smoker    Packs/day: 0.50    Years: 11.00    Pack years: 5.50    Types: Cigarettes  . Smokeless tobacco: Never Used  Substance and Sexual Activity  . Alcohol use: No    Alcohol/week: 0.0 standard drinks  . Drug use: No  . Sexual activity: Yes    Birth control/protection: None  Other Topics Concern  . Not on file  Social History Narrative   Drinks one cup caffeine daily         Social Determinants of Health   Financial Resource Strain:   . Difficulty of Paying Living Expenses: Not on file  Food Insecurity:   . Worried About Charity fundraiser in the Last Year: Not on file  . Ran Out of Food in the Last  Year: Not on file  Transportation Needs:   . Lack of Transportation (Medical): Not on file  . Lack of Transportation (Non-Medical): Not on file  Physical Activity:   . Days of Exercise per Week: Not on file  . Minutes of Exercise per Session: Not on file  Stress:   . Feeling of Stress : Not on file  Social Connections:   . Frequency of Communication with Friends and Family: Not on file  . Frequency of Social Gatherings with Friends and Family: Not on file  . Attends Religious Services: Not on file  . Active Member of Clubs or Organizations: Not on file  . Attends Archivist Meetings: Not on file  . Marital Status: Not on file  Intimate Partner Violence:   . Fear of Current or Ex-Partner: Not on file  . Emotionally Abused: Not on file  . Physically Abused: Not on file  . Sexually Abused: Not on file   Family History  Problem Relation Age of Onset  . Heart disease Father        cardiomyopathy (HTN)  . Hypertension Father   . Hypertension Mother   . Hypertension Sister   . Hypertension Brother      Review of Systems  All other systems reviewed and are negative.      Objective:   Physical Exam  Constitutional: She is oriented to person, place, and time. She appears well-developed and well-nourished. No distress.  HENT:  Head: Normocephalic and atraumatic.  Right Ear: External ear normal.  Left Ear: External ear normal.  Nose: Nose normal.  Mouth/Throat: Oropharynx is clear and moist. No oropharyngeal exudate.  Eyes: Pupils are equal, round, and reactive to light. Conjunctivae and EOM are normal. Right eye exhibits no discharge. Left eye exhibits no discharge. No scleral icterus.  Neck: No JVD present. No tracheal deviation present. No thyromegaly present.  Cardiovascular: Normal rate, regular rhythm, normal heart sounds and intact distal pulses. Exam reveals no gallop and no friction rub.  No murmur heard. Pulmonary/Chest: Effort normal and breath sounds  normal. No stridor. No respiratory distress. She has no wheezes. She has no rales. She exhibits no tenderness.  Abdominal: Soft. Bowel sounds are normal. She exhibits no distension and no mass. There is no abdominal tenderness. There is no rebound and no guarding.  Musculoskeletal:        General: No tenderness or edema. Normal range of motion.     Cervical back: Normal range of motion and neck supple.  Lymphadenopathy:    She has no cervical adenopathy.  Neurological: She  is alert and oriented to person, place, and time. She displays normal reflexes. No cranial nerve deficit. She exhibits normal muscle tone. Coordination normal.  Skin: No rash noted. She is not diaphoretic. No erythema. No pallor.  Psychiatric: She has a normal mood and affect. Her behavior is normal. Judgment and thought content normal.  Vitals reviewed.         Assessment & Plan:  General medical exam - Plan: CBC with Differential, COMPLETE METABOLIC PANEL WITH GFR, Lipid Panel  Physical exam today is completely normal.  Patient has a small lipoma on her left lower back.  Otherwise exam is benign.  I recommended a mammogram at her earliest convenience.  Pap smear is not required.  Colonoscopy is up-to-date.  Strongly recommended a flu shot but she politely declined.  Also encouraged the patient to get the shingles vaccine.  Recommended 1200 mg a day of calcium as well as 1000 units a day of vitamin D.  Patient declines hepatitis C or HIV screening.  Recommended try meloxicam 15 mg a day for shoulder pain as needed.

## 2019-03-26 ENCOUNTER — Other Ambulatory Visit: Payer: Self-pay | Admitting: Family Medicine

## 2019-03-26 DIAGNOSIS — E7800 Pure hypercholesterolemia, unspecified: Secondary | ICD-10-CM

## 2019-03-26 LAB — COMPLETE METABOLIC PANEL WITH GFR
AG Ratio: 1.4 (calc) (ref 1.0–2.5)
ALT: 23 U/L (ref 6–29)
AST: 18 U/L (ref 10–35)
Albumin: 4.3 g/dL (ref 3.6–5.1)
Alkaline phosphatase (APISO): 50 U/L (ref 37–153)
BUN: 20 mg/dL (ref 7–25)
CO2: 29 mmol/L (ref 20–32)
Calcium: 9.6 mg/dL (ref 8.6–10.4)
Chloride: 104 mmol/L (ref 98–110)
Creat: 0.88 mg/dL (ref 0.50–0.99)
GFR, Est African American: 82 mL/min/{1.73_m2} (ref >=60)
GFR, Est Non African American: 70 mL/min/{1.73_m2} (ref >=60)
Globulin: 3.1 g/dL (calc) (ref 1.9–3.7)
Glucose, Bld: 94 mg/dL (ref 65–99)
Potassium: 4.7 mmol/L (ref 3.5–5.3)
Sodium: 143 mmol/L (ref 135–146)
Total Bilirubin: 0.5 mg/dL (ref 0.2–1.2)
Total Protein: 7.4 g/dL (ref 6.1–8.1)

## 2019-03-26 LAB — CBC WITH DIFFERENTIAL/PLATELET
Absolute Monocytes: 744 cells/uL (ref 200–950)
Basophils Absolute: 48 cells/uL (ref 0–200)
Basophils Relative: 0.6 %
Eosinophils Absolute: 280 cells/uL (ref 15–500)
Eosinophils Relative: 3.5 %
HCT: 40.5 % (ref 35.0–45.0)
Hemoglobin: 13.3 g/dL (ref 11.7–15.5)
Lymphs Abs: 3208 cells/uL (ref 850–3900)
MCH: 28.4 pg (ref 27.0–33.0)
MCHC: 32.8 g/dL (ref 32.0–36.0)
MCV: 86.5 fL (ref 80.0–100.0)
MPV: 11 fL (ref 7.5–12.5)
Monocytes Relative: 9.3 %
Neutro Abs: 3720 cells/uL (ref 1500–7800)
Neutrophils Relative %: 46.5 %
Platelets: 343 10*3/uL (ref 140–400)
RBC: 4.68 10*6/uL (ref 3.80–5.10)
RDW: 12.8 % (ref 11.0–15.0)
Total Lymphocyte: 40.1 %
WBC: 8 10*3/uL (ref 3.8–10.8)

## 2019-03-26 LAB — LIPID PANEL
Cholesterol: 252 mg/dL — ABNORMAL HIGH (ref <200)
HDL: 45 mg/dL — ABNORMAL LOW (ref >=50)
LDL Cholesterol (Calc): 165 mg/dL (calc) — ABNORMAL HIGH
Non-HDL Cholesterol (Calc): 207 mg/dL (calc) — ABNORMAL HIGH (ref <130)
Total CHOL/HDL Ratio: 5.6 (calc) — ABNORMAL HIGH (ref <5.0)
Triglycerides: 256 mg/dL — ABNORMAL HIGH (ref <150)

## 2019-09-05 DIAGNOSIS — M722 Plantar fascial fibromatosis: Secondary | ICD-10-CM | POA: Insufficient documentation

## 2019-09-05 DIAGNOSIS — D179 Benign lipomatous neoplasm, unspecified: Secondary | ICD-10-CM | POA: Insufficient documentation

## 2019-09-19 ENCOUNTER — Other Ambulatory Visit: Payer: Self-pay | Admitting: Family Medicine

## 2019-09-26 ENCOUNTER — Other Ambulatory Visit: Payer: Self-pay | Admitting: Family Medicine

## 2019-09-26 NOTE — Telephone Encounter (Signed)
Pt call stating the [facility]  pharmacy denied refill on Triamterene-Hydrochlorothiazide pharmacy said that she hasn't been seen since 08/2018 had her CPE on 03/25/19

## 2019-10-25 ENCOUNTER — Other Ambulatory Visit: Payer: No Typology Code available for payment source

## 2019-12-30 ENCOUNTER — Other Ambulatory Visit: Payer: Self-pay | Admitting: Family Medicine

## 2019-12-30 MED ORDER — TRIAMTERENE-HCTZ 37.5-25 MG PO TABS: 1.0000 | ORAL_TABLET | Freq: Every day | ORAL | 0 refills | Status: DC

## 2019-12-30 NOTE — Telephone Encounter (Signed)
Refill Triamterene hydrochlorothiazide 90 day supply [id number] - [place], [place]

## 2019-12-30 NOTE — Telephone Encounter (Signed)
Prescription sent to pharmacy.

## 2020-03-26 ENCOUNTER — Encounter: Payer: Self-pay | Admitting: Family Medicine

## 2020-04-17 ENCOUNTER — Encounter: Payer: Self-pay | Admitting: Family Medicine

## 2020-04-21 ENCOUNTER — Other Ambulatory Visit: Payer: Self-pay

## 2020-04-21 DIAGNOSIS — E559 Vitamin D deficiency, unspecified: Secondary | ICD-10-CM

## 2020-04-21 DIAGNOSIS — Z1329 Encounter for screening for other suspected endocrine disorder: Secondary | ICD-10-CM

## 2020-04-21 DIAGNOSIS — Z13220 Encounter for screening for lipoid disorders: Secondary | ICD-10-CM

## 2020-04-21 DIAGNOSIS — E7800 Pure hypercholesterolemia, unspecified: Secondary | ICD-10-CM

## 2020-04-21 DIAGNOSIS — Z1159 Encounter for screening for other viral diseases: Secondary | ICD-10-CM

## 2020-04-22 LAB — COMPLETE METABOLIC PANEL WITH GFR
AG Ratio: 1.4 (calc) (ref 1.0–2.5)
ALT: 15 U/L (ref 6–29)
AST: 18 U/L (ref 10–35)
Albumin: 4.4 g/dL (ref 3.6–5.1)
Alkaline phosphatase (APISO): 54 U/L (ref 37–153)
BUN: 16 mg/dL (ref 7–25)
CO2: 25 mmol/L (ref 20–32)
Calcium: 9.7 mg/dL (ref 8.6–10.4)
Chloride: 102 mmol/L (ref 98–110)
Creat: 0.7 mg/dL (ref 0.50–0.99)
GFR, Est African American: 107 mL/min/{1.73_m2} (ref >=60)
GFR, Est Non African American: 92 mL/min/{1.73_m2} (ref >=60)
Globulin: 3.1 g/dL (calc) (ref 1.9–3.7)
Glucose, Bld: 90 mg/dL (ref 65–99)
Potassium: 4.1 mmol/L (ref 3.5–5.3)
Sodium: 140 mmol/L (ref 135–146)
Total Bilirubin: 0.3 mg/dL (ref 0.2–1.2)
Total Protein: 7.5 g/dL (ref 6.1–8.1)

## 2020-04-22 LAB — CBC WITH DIFFERENTIAL/PLATELET
Absolute Monocytes: 621 cells/uL (ref 200–950)
Basophils Absolute: 48 cells/uL (ref 0–200)
Basophils Relative: 0.7 %
Eosinophils Absolute: 179 cells/uL (ref 15–500)
Eosinophils Relative: 2.6 %
HCT: 39.9 % (ref 35.0–45.0)
Hemoglobin: 13.4 g/dL (ref 11.7–15.5)
Lymphs Abs: 3505 cells/uL (ref 850–3900)
MCH: 29.1 pg (ref 27.0–33.0)
MCHC: 33.6 g/dL (ref 32.0–36.0)
MCV: 86.6 fL (ref 80.0–100.0)
MPV: 10.2 fL (ref 7.5–12.5)
Monocytes Relative: 9 %
Neutro Abs: 2546 cells/uL (ref 1500–7800)
Neutrophils Relative %: 36.9 %
Platelets: 355 10*3/uL (ref 140–400)
RBC: 4.61 10*6/uL (ref 3.80–5.10)
RDW: 12.9 % (ref 11.0–15.0)
Total Lymphocyte: 50.8 %
WBC: 6.9 10*3/uL (ref 3.8–10.8)

## 2020-04-22 LAB — TSH: TSH: 0.4 mIU/L (ref 0.40–4.50)

## 2020-04-22 LAB — LIPID PANEL
Cholesterol: 230 mg/dL — ABNORMAL HIGH (ref <200)
HDL: 54 mg/dL (ref >=50)
LDL Cholesterol (Calc): 151 mg/dL (calc) — ABNORMAL HIGH
Non-HDL Cholesterol (Calc): 176 mg/dL (calc) — ABNORMAL HIGH (ref <130)
Total CHOL/HDL Ratio: 4.3 (calc) (ref <5.0)
Triglycerides: 127 mg/dL (ref <150)

## 2020-04-22 LAB — HEPATITIS C ANTIBODY
Hepatitis C Ab: NONREACTIVE
SIGNAL TO CUT-OFF: 0.01 (ref <1.00)

## 2020-04-22 LAB — VITAMIN D 25 HYDROXY (VIT D DEFICIENCY, FRACTURES): Vit D, 25-Hydroxy: 39 ng/mL (ref 30–100)

## 2020-04-23 ENCOUNTER — Ambulatory Visit (INDEPENDENT_AMBULATORY_CARE_PROVIDER_SITE_OTHER): Payer: Self-pay | Admitting: Family Medicine

## 2020-04-23 ENCOUNTER — Other Ambulatory Visit: Payer: Self-pay

## 2020-04-23 VITALS — BP 124/78 | HR 94 | Temp 95.4°F | Ht 65.0 in | Wt 173.0 lb

## 2020-04-23 DIAGNOSIS — E7800 Pure hypercholesterolemia, unspecified: Secondary | ICD-10-CM

## 2020-04-23 DIAGNOSIS — Z0001 Encounter for general adult medical examination with abnormal findings: Secondary | ICD-10-CM

## 2020-04-23 DIAGNOSIS — Z23 Encounter for immunization: Secondary | ICD-10-CM

## 2020-04-23 DIAGNOSIS — Z0000 Encounter for general adult medical examination without abnormal findings: Secondary | ICD-10-CM

## 2020-04-23 MED ORDER — TIZANIDINE HCL 4 MG PO CAPS: 4.0000 mg | ORAL_CAPSULE | Freq: Three times a day (TID) | ORAL | 0 refills | Status: DC | PRN

## 2020-04-23 MED ORDER — TRIAMTERENE-HCTZ 37.5-25 MG PO TABS: 1.0000 | ORAL_TABLET | Freq: Every day | ORAL | 0 refills | Status: DC

## 2020-04-23 NOTE — Progress Notes (Signed)
Subjective:    Patient ID: Alison [name], female    DOB: 11-07-1955, 65 y.o.   MRN: [id number]  Patient is a very pleasant 65 year old African-American female who is here today for complete physical exam.  She has had all 3 doses of her COVID vaccine.  She has not had her flu shot.  We discussed this today and she politely declines.  She does have a history of asthma and she is also had pneumonia in the past.  Therefore she is interested in Pneumovax 23.  She is also due for the shingles shot and I recommended that she get this at her earliest convenience.  Her last colonoscopy was in 2016.  They found 4 polyps but these were benign polyps.  Therefore she is due for her next colonoscopy in 2026.  She will be due for a bone density test at 66.  She gets her Pap smear and pelvic exam by her gynecologist and she has the schedule for later today.  She also has her mammogram scheduled for later today.  She sees an eye doctor once a year Lab on 04/21/2020  Component Date Value Ref Range Status  . WBC 04/21/2020 6.9  3.8 - 10.8 Thousand/uL Final  . RBC 04/21/2020 4.61  3.80 - 5.10 Million/uL Final  . Hemoglobin 04/21/2020 13.4  11.7 - 15.5 g/dL Final  . HCT 04/21/2020 39.9  35.0 - 45.0 % Final  . MCV 04/21/2020 86.6  80.0 - 100.0 fL Final  . MCH 04/21/2020 29.1  27.0 - 33.0 pg Final  . MCHC 04/21/2020 33.6  32.0 - 36.0 g/dL Final  . RDW 04/21/2020 12.9  11.0 - 15.0 % Final  . Platelets 04/21/2020 355  140 - 400 Thousand/uL Final  . MPV 04/21/2020 10.2  7.5 - 12.5 fL Final  . Neutro Abs 04/21/2020 2,546  1,500 - 7,800 cells/uL Final  . Lymphs Abs 04/21/2020 3,505  850 - 3,900 cells/uL Final  . Absolute Monocytes 04/21/2020 621  200 - 950 cells/uL Final  . Eosinophils Absolute 04/21/2020 179  15 - 500 cells/uL Final  . Basophils Absolute 04/21/2020 48  0 - 200 cells/uL Final  . Neutrophils Relative % 04/21/2020 36.9  % Final  . Total Lymphocyte 04/21/2020 50.8  % Final  . Monocytes Relative  04/21/2020 9.0  % Final  . Eosinophils Relative 04/21/2020 2.6  % Final  . Basophils Relative 04/21/2020 0.7  % Final  . Glucose, Bld 04/21/2020 90  65 - 99 mg/dL Final   Comment: .            Fasting reference interval .   . BUN 04/21/2020 16  7 - 25 mg/dL Final  . Creat 04/21/2020 0.70  0.50 - 0.99 mg/dL Final   Comment: Verified by repeat analysis. . For patients >23 years of age, the reference limit for Creatinine is approximately 13% higher for people identified as African-American. .   . GFR, Est Non African American 04/21/2020 92  > OR = 60 mL/min/1.46m2 Final  . GFR, Est African American 04/21/2020 107  > OR = 60 mL/min/1.56m2 Final  . BUN/Creatinine Ratio 60/63/0160 NOT APPLICABLE  6 - 22 (calc) Final  . Sodium 04/21/2020 140  135 - 146 mmol/L Final  . Potassium 04/21/2020 4.1  3.5 - 5.3 mmol/L Final  . Chloride 04/21/2020 102  98 - 110 mmol/L Final  . CO2 04/21/2020 25  20 - 32 mmol/L Final   Comment: Verified by repeat analysis. Marland Kitchen   Marland Kitchen  Calcium 04/21/2020 9.7  8.6 - 10.4 mg/dL Final  . Total Protein 04/21/2020 7.5  6.1 - 8.1 g/dL Final  . Albumin 04/21/2020 4.4  3.6 - 5.1 g/dL Final  . Globulin 04/21/2020 3.1  1.9 - 3.7 g/dL (calc) Final  . AG Ratio 04/21/2020 1.4  1.0 - 2.5 (calc) Final  . Total Bilirubin 04/21/2020 0.3  0.2 - 1.2 mg/dL Final  . Alkaline phosphatase (APISO) 04/21/2020 54  37 - 153 U/L Final  . AST 04/21/2020 18  10 - 35 U/L Final  . ALT 04/21/2020 15  6 - 29 U/L Final  . Cholesterol 04/21/2020 230* <200 mg/dL Final  . HDL 04/21/2020 54  > OR = 50 mg/dL Final  . Triglycerides 04/21/2020 127  <150 mg/dL Final  . LDL Cholesterol (Calc) 04/21/2020 151* mg/dL (calc) Final   Comment: Reference range: <100 . Desirable range <100 mg/dL for primary prevention;   <70 mg/dL for patients with CHD or diabetic patients  with > or = 2 CHD risk factors. Marland Kitchen LDL-C is now calculated using the Martin-Hopkins  calculation, which is a validated novel method  providing  better accuracy than the Friedewald equation in the  estimation of LDL-C.  Cresenciano Genre et al. Annamaria Helling. 0981;191(47): 2061-2068  (http://education.[facility].com/faq/FAQ164)   . Total CHOL/HDL Ratio 04/21/2020 4.3  <5.0 (calc) Final  . Non-HDL Cholesterol (Calc) 04/21/2020 176* <130 mg/dL (calc) Final   Comment: For patients with diabetes plus 1 major ASCVD risk  factor, treating to a non-HDL-C goal of <100 mg/dL  (LDL-C of <70 mg/dL) is considered a therapeutic  option.   . TSH 04/21/2020 0.40  0.40 - 4.50 mIU/L Final  . Vit D, 25-Hydroxy 04/21/2020 39  30 - 100 ng/mL Final   Comment: Vitamin D Status         25-OH Vitamin D: . Deficiency:                    <20 ng/mL Insufficiency:             20 - 29 ng/mL Optimal:                 > or = 30 ng/mL . For 25-OH Vitamin D testing on patients on  D2-supplementation and patients for whom quantitation  of D2 and D3 fractions is required, the [facility](TM) 25-OH VIT D, (D2,D3), LC/MS/MS is recommended: order  code [phone] (patients >87yrs). See Note 1 . Note 1 . For additional information, please refer to  http://education.[facility].com/faq/FAQ199  (This link is being provided for informational/ educational purposes only.)   . Hepatitis C Ab 04/21/2020 NON-REACTIVE  NON-REACTI Final  . SIGNAL TO CUT-OFF 04/21/2020 0.01  <1.00 Final   Comment: . HCV antibody was non-reactive. There is no laboratory  evidence of HCV infection. . In most cases, no further action is required. However, if recent HCV exposure is suspected, a test for HCV RNA (test code [phone]) is suggested. . For additional information please refer to http://education.[facility].com/faq/FAQ22v1 (This link is being provided for informational/ educational purposes only.) .    Past Medical History:  Diagnosis Date  . Allergy   . Asthma   . Bone spur 88   Left Knee  . Colon polyps   . Fibroadenoma of breast    Right Breast  .  Headache   . Heel spur    Right Foot  . Hypertension   . Obese   . Pneumonia 1990   Past Surgical History:  Procedure Laterality  Date  . BUNIONECTOMY Right 12/04/2014   @[facility]   . CESAREAN SECTION    . MASS EXCISION Right 03/16/2015   Procedure: EXCISION OF RIGHT ANTERIOR SHOULDER SOFT TISSUE MASS;  Surgeon: Armandina Gemma, MD;  Location: [facility];  Service: General;  Laterality: Right;  Marland Kitchen VAGINAL HYSTERECTOMY     Fibroids   Current Outpatient Medications on File Prior to Visit  Medication Sig Dispense Refill  . Ascorbic Acid (VITAMIN C) 100 MG tablet Take 500 mg by mouth daily.    Marland Kitchen ELDERBERRY PO Take by mouth.    Marland Kitchen albuterol (PROAIR HFA) 108 (90 Base) MCG/ACT inhaler 2 puffs every 4 hours as needed only  if your can't catch your breath (Patient not taking: Reported on 03/25/2019) 1 Inhaler 0  . meloxicam (MOBIC) 15 MG tablet Take 1 tablet (15 mg total) by mouth daily. 30 tablet 1  . zolpidem (AMBIEN) 5 MG tablet Take 5 mg by mouth at bedtime as needed. For insomnia     No current facility-administered medications on file prior to visit.   Allergies  Allergen Reactions  . Penicillin G Other (See Comments)  . Penicillins Other (See Comments)    REACTION: as child   Social History   Socioeconomic History  . Marital status: Married    Spouse name: Not on file  . Number of children: 1  . Years of education: Not on file  . Highest education level: Not on file  Occupational History  . Occupation: Haematologist: [employer]  Tobacco Use  . Smoking status: Current Every Day Smoker    Packs/day: 0.50    Years: 11.00    Pack years: 5.50    Types: Cigarettes  . Smokeless tobacco: Never Used  Substance and Sexual Activity  . Alcohol use: No    Alcohol/week: 0.0 standard drinks  . Drug use: No  . Sexual activity: Yes    Birth control/protection: None  Other Topics Concern  . Not on file  Social History Narrative   Drinks one cup  caffeine daily         Social Determinants of Health   Financial Resource Strain: Not on file  Food Insecurity: Not on file  Transportation Needs: Not on file  Physical Activity: Not on file  Stress: Not on file  Social Connections: Not on file  Intimate Partner Violence: Not on file   Family History  Problem Relation Age of Onset  . Heart disease Father        cardiomyopathy (HTN)  . Hypertension Father   . Hypertension Mother   . Hypertension Sister   . Hypertension Brother      Review of Systems  All other systems reviewed and are negative.      Objective:   Physical Exam Vitals reviewed.  Constitutional:      General: She is not in acute distress.    Appearance: She is well-developed. She is not diaphoretic.  HENT:     Head: Normocephalic and atraumatic.     Right Ear: External ear normal.     Left Ear: External ear normal.     Nose: Nose normal.     Mouth/Throat:     Pharynx: No oropharyngeal exudate.  Eyes:     General: No scleral icterus.       Right eye: No discharge.        Left eye: No discharge.     Conjunctiva/sclera: Conjunctivae normal.  Pupils: Pupils are equal, round, and reactive to light.  Neck:     Thyroid: No thyromegaly.     Vascular: No JVD.     Trachea: No tracheal deviation.  Cardiovascular:     Rate and Rhythm: Normal rate and regular rhythm.     Heart sounds: Normal heart sounds. No murmur heard. No friction rub. No gallop.   Pulmonary:     Effort: Pulmonary effort is normal. No respiratory distress.     Breath sounds: Normal breath sounds. No stridor. No wheezing or rales.  Chest:     Chest wall: No tenderness.  Abdominal:     General: Bowel sounds are normal. There is no distension.     Palpations: Abdomen is soft. There is no mass.     Tenderness: There is no abdominal tenderness. There is no guarding or rebound.  Musculoskeletal:        General: No tenderness. Normal range of motion.     Cervical back: Normal range  of motion and neck supple.  Lymphadenopathy:     Cervical: No cervical adenopathy.  Skin:    Coloration: Skin is not pale.     Findings: No erythema or rash.  Neurological:     Mental Status: She is alert and oriented to person, place, and time.     Cranial Nerves: No cranial nerve deficit.     Motor: No abnormal muscle tone.     Coordination: Coordination normal.     Deep Tendon Reflexes: Reflexes normal.  Psychiatric:        Behavior: Behavior normal.        Thought Content: Thought content normal.        Judgment: Judgment normal.           Assessment & Plan:  Need for vaccination - Plan: Pneumococcal polysaccharide vaccine 23-valent greater than or equal to 2yo subcutaneous/IM  General medical exam  Pure hypercholesterolemia  Physical exam today is completely normal.  My biggest risk for the patient is her cholesterol.  I calculated her 10-year risk of cardiovascular disease to be 8.5%.  Based on this I would recommend a statin.  Patient is hesitant to try any statin and would like to try aggressive lifestyle changes.  I did recommend fish oil 2000 mg a day.  Return in 6 months and recheck a fasting lipid panel at that time.  She received Pneumovax 23 today in clinic.  She declines a flu shot.  She is fully vaccinated for COVID.  I did recommend the shingles vaccine.  Defer her mammogram and her Pap smear to her gynecologist.  Bone density test will be performed when she turns 66.  I did recommend 1200 mg a day of calcium and 1000 units a day of vitamin D.

## 2020-06-23 ENCOUNTER — Telehealth: Payer: Self-pay | Admitting: Family Medicine

## 2020-06-23 NOTE — Telephone Encounter (Signed)
Left message on patient's cell phone to inform her that we heard from [place] regarding her bill. Told patient to disregard bill since discounted rate wasn't applied; advised patient that bill will be revised with discounted rate.

## 2020-09-03 ENCOUNTER — Other Ambulatory Visit: Payer: Self-pay | Admitting: *Deleted

## 2020-09-03 MED ORDER — TRIAMTERENE-HCTZ 37.5-25 MG PO TABS: 1.0000 | ORAL_TABLET | Freq: Every day | ORAL | 2 refills | Status: DC

## 2020-09-25 ENCOUNTER — Other Ambulatory Visit: Payer: Self-pay | Admitting: Family Medicine

## 2020-09-30 ENCOUNTER — Other Ambulatory Visit: Payer: Self-pay | Admitting: Family Medicine

## 2020-09-30 DIAGNOSIS — E7800 Pure hypercholesterolemia, unspecified: Secondary | ICD-10-CM

## 2020-09-30 DIAGNOSIS — E559 Vitamin D deficiency, unspecified: Secondary | ICD-10-CM

## 2020-10-01 ENCOUNTER — Other Ambulatory Visit: Payer: Self-pay

## 2020-10-01 DIAGNOSIS — E559 Vitamin D deficiency, unspecified: Secondary | ICD-10-CM

## 2020-10-01 DIAGNOSIS — E7800 Pure hypercholesterolemia, unspecified: Secondary | ICD-10-CM

## 2020-10-01 LAB — COMPLETE METABOLIC PANEL WITH GFR
AG Ratio: 1.4 (calc) (ref 1.0–2.5)
ALT: 14 U/L (ref 6–29)
AST: 15 U/L (ref 10–35)
Albumin: 4.2 g/dL (ref 3.6–5.1)
Alkaline phosphatase (APISO): 52 U/L (ref 37–153)
BUN: 17 mg/dL (ref 7–25)
CO2: 30 mmol/L (ref 20–32)
Calcium: 9.5 mg/dL (ref 8.6–10.4)
Chloride: 102 mmol/L (ref 98–110)
Creat: 0.91 mg/dL (ref 0.50–0.99)
GFR, Est African American: 78 mL/min/{1.73_m2} (ref >=60)
GFR, Est Non African American: 67 mL/min/{1.73_m2} (ref >=60)
Globulin: 3.1 g/dL (calc) (ref 1.9–3.7)
Glucose, Bld: 102 mg/dL — ABNORMAL HIGH (ref 65–99)
Potassium: 4.4 mmol/L (ref 3.5–5.3)
Sodium: 141 mmol/L (ref 135–146)
Total Bilirubin: 0.4 mg/dL (ref 0.2–1.2)
Total Protein: 7.3 g/dL (ref 6.1–8.1)

## 2020-10-01 LAB — EXTRA LAV TOP TUBE

## 2020-10-01 LAB — LIPID PANEL
Cholesterol: 226 mg/dL — ABNORMAL HIGH (ref <200)
HDL: 48 mg/dL — ABNORMAL LOW (ref >=50)
LDL Cholesterol (Calc): 150 mg/dL (calc) — ABNORMAL HIGH
Non-HDL Cholesterol (Calc): 178 mg/dL (calc) — ABNORMAL HIGH (ref <130)
Total CHOL/HDL Ratio: 4.7 (calc) (ref <5.0)
Triglycerides: 150 mg/dL — ABNORMAL HIGH (ref <150)

## 2020-10-01 LAB — VITAMIN D 25 HYDROXY (VIT D DEFICIENCY, FRACTURES): Vit D, 25-Hydroxy: 40 ng/mL (ref 30–100)

## 2020-10-01 LAB — TSH: TSH: 0.45 mIU/L (ref 0.40–4.50)

## 2020-10-02 ENCOUNTER — Encounter: Payer: Self-pay | Admitting: Family Medicine

## 2020-10-02 ENCOUNTER — Ambulatory Visit (INDEPENDENT_AMBULATORY_CARE_PROVIDER_SITE_OTHER): Payer: Self-pay | Admitting: Family Medicine

## 2020-10-02 VITALS — BP 118/64 | HR 92 | Temp 97.9°F | Resp 16 | Ht 65.0 in | Wt 172.0 lb

## 2020-10-02 DIAGNOSIS — E7849 Other hyperlipidemia: Secondary | ICD-10-CM

## 2020-10-02 DIAGNOSIS — I10 Essential (primary) hypertension: Secondary | ICD-10-CM

## 2020-10-02 DIAGNOSIS — B351 Tinea unguium: Secondary | ICD-10-CM

## 2020-10-02 DIAGNOSIS — B359 Dermatophytosis, unspecified: Secondary | ICD-10-CM

## 2020-10-02 MED ORDER — TERBINAFINE HCL 250 MG PO TABS: 250.0000 mg | ORAL_TABLET | Freq: Every day | ORAL | 2 refills | Status: DC

## 2020-10-02 MED ORDER — TIZANIDINE HCL 4 MG PO CAPS: 4.0000 mg | ORAL_CAPSULE | Freq: Three times a day (TID) | ORAL | 1 refills | Status: AC | PRN

## 2020-10-02 MED ORDER — ROSUVASTATIN CALCIUM 5 MG PO TABS: 5.0000 mg | ORAL_TABLET | Freq: Every day | ORAL | 3 refills | Status: DC

## 2020-10-02 MED ORDER — ALBUTEROL SULFATE HFA 108 (90 BASE) MCG/ACT IN AERS: 2.0000 | INHALATION_SPRAY | RESPIRATORY_TRACT | 1 refills | Status: AC | PRN

## 2020-10-02 NOTE — Progress Notes (Signed)
Subjective:    Patient ID: Alison [name], female    DOB: 18-Nov-1955, 65 y.o.   MRN: [id number] Patient is a very pleasant 65 year old African-American female who is here today for a checkup.  Her most recent lab work below does show elevated cholesterol and her 10-year risk of cardiovascular disease is 7.7%.  I have recommended a statin given her age and hypertension and she is now willing to try statin.  She also has a rash on her right medial upper thigh.  I examined this area with a chaperone present.  There is a red ring with a central clearing that is 11 cm in diameter extending from her perineum down to her upper medial right thigh.  It appears to be ringworm.  She also has ringworm and what appears to be athlete's foot on both feet and onychomycosis on her toenails.  She would like to treat this all simultaneously with Lamisil. Appointment on 10/01/2020  Component Date Value Ref Range Status   Glucose, Bld 10/01/2020 102 (A) 65 - 99 mg/dL Final   Comment: .            Fasting reference interval . For someone without known diabetes, a glucose value between 100 and 125 mg/dL is consistent with prediabetes and should be confirmed with a follow-up test. .    BUN 10/01/2020 17  7 - 25 mg/dL Final   Creat 10/01/2020 0.91  0.50 - 0.99 mg/dL Final   Comment: For patients >64 years of age, the reference limit for Creatinine is approximately 13% higher for people identified as African-American. .    GFR, Est Non African American 10/01/2020 67  > OR = 60 mL/min/1.80m2 Final   GFR, Est African American 10/01/2020 78  > OR = 60 mL/min/1.64m2 Final   BUN/Creatinine Ratio 07/37/1062 NOT APPLICABLE  6 - 22 (calc) Final   Sodium 10/01/2020 141  135 - 146 mmol/L Final   Potassium 10/01/2020 4.4  3.5 - 5.3 mmol/L Final   Chloride 10/01/2020 102  98 - 110 mmol/L Final   CO2 10/01/2020 30  20 - 32 mmol/L Final   Calcium 10/01/2020 9.5  8.6 - 10.4 mg/dL Final   Total Protein 10/01/2020 7.3   6.1 - 8.1 g/dL Final   Albumin 10/01/2020 4.2  3.6 - 5.1 g/dL Final   Globulin 10/01/2020 3.1  1.9 - 3.7 g/dL (calc) Final   AG Ratio 10/01/2020 1.4  1.0 - 2.5 (calc) Final   Total Bilirubin 10/01/2020 0.4  0.2 - 1.2 mg/dL Final   Alkaline phosphatase (APISO) 10/01/2020 52  37 - 153 U/L Final   AST 10/01/2020 15  10 - 35 U/L Final   ALT 10/01/2020 14  6 - 29 U/L Final   Vit D, 25-Hydroxy 10/01/2020 40  30 - 100 ng/mL Final   Comment: Vitamin D Status         25-OH Vitamin D: . Deficiency:                    <20 ng/mL Insufficiency:             20 - 29 ng/mL Optimal:                 > or = 30 ng/mL . For 25-OH Vitamin D testing on patients on  D2-supplementation and patients for whom quantitation  of D2 and D3 fractions is required, the [facility](TM) 25-OH VIT D, (D2,D3), LC/MS/MS is recommended: order  code [phone] (patients >64yrs).  See Note 1 . Note 1 . For additional information, please refer to  http://education.[facility].com/faq/FAQ199  (This link is being provided for informational/ educational purposes only.)    Cholesterol 10/01/2020 226 (A) <200 mg/dL Final   HDL 10/01/2020 48 (A) > OR = 50 mg/dL Final   Triglycerides 10/01/2020 150 (A) <150 mg/dL Final   LDL Cholesterol (Calc) 10/01/2020 150 (A) mg/dL (calc) Final   Comment: Reference range: <100 . Desirable range <100 mg/dL for primary prevention;   <70 mg/dL for patients with CHD or diabetic patients  with > or = 2 CHD risk factors. Marland Kitchen LDL-C is now calculated using the Martin-Hopkins  calculation, which is a validated novel method providing  better accuracy than the Friedewald equation in the  estimation of LDL-C.  Cresenciano Genre et al. Annamaria Helling. 8527;782(42): 2061-2068  (http://education.[facility].com/faq/FAQ164)    Total CHOL/HDL Ratio 10/01/2020 4.7  <5.0 (calc) Final   Non-HDL Cholesterol (Calc) 10/01/2020 178 (A) <130 mg/dL (calc) Final   Comment: For patients with diabetes plus 1 major ASCVD risk   factor, treating to a non-HDL-C goal of <100 mg/dL  (LDL-C of <70 mg/dL) is considered a therapeutic  option.    TSH 10/01/2020 0.45  0.40 - 4.50 mIU/L Final   EXTRA LAVENDER-TOP TUBE 10/01/2020    Final   Comment: We received an extra specimen with no test requested. If any test is desired for this specimen please call client services and advise.    Past Medical History:  Diagnosis Date   Allergy    Asthma    Bone spur 87   Left Knee   Colon polyps    Fibroadenoma of breast    Right Breast   Headache    Heel spur    Right Foot   Hypertension    Obese    Pneumonia 1990   Past Surgical History:  Procedure Laterality Date   BUNIONECTOMY Right 12/04/2014   @[facility]    CESAREAN SECTION     MASS EXCISION Right 03/16/2015   Procedure: EXCISION OF RIGHT ANTERIOR SHOULDER SOFT TISSUE MASS;  Surgeon: Armandina Gemma, MD;  Location: [facility];  Service: General;  Laterality: Right;   VAGINAL HYSTERECTOMY     Fibroids   Current Outpatient Medications on File Prior to Visit  Medication Sig Dispense Refill   Ascorbic Acid (VITAMIN C) 100 MG tablet Take 500 mg by mouth daily.     Biotin 1000 MCG CHEW Chew by mouth.     ELDERBERRY PO Take by mouth.     meloxicam (MOBIC) 15 MG tablet Take 1 tablet (15 mg total) by mouth daily. 30 tablet 1   triamterene-hydrochlorothiazide (MAXZIDE-25) 37.5-25 MG tablet TAKE 1 TABLET BY MOUTH DAILY 90 tablet 2   zolpidem (AMBIEN) 5 MG tablet Take 5 mg by mouth at bedtime as needed. For insomnia     No current facility-administered medications on file prior to visit.   Allergies  Allergen Reactions   Penicillin G Other (See Comments)   Penicillins Other (See Comments)    REACTION: as child   Social History   Socioeconomic History   Marital status: Married    Spouse name: Not on file   Number of children: 1   Years of education: Not on file   Highest education level: Not on file  Occupational History   Occupation: Warden/ranger    Employer: [employer]  Tobacco Use   Smoking status: Every Day    Packs/day: 0.50    Years:  11.00    Pack years: 5.50    Types: Cigarettes   Smokeless tobacco: Never  Substance and Sexual Activity   Alcohol use: No    Alcohol/week: 0.0 standard drinks   Drug use: No   Sexual activity: Yes    Birth control/protection: None  Other Topics Concern   Not on file  Social History Narrative   Drinks one cup caffeine daily         Social Determinants of Health   Financial Resource Strain: Not on file  Food Insecurity: Not on file  Transportation Needs: Not on file  Physical Activity: Not on file  Stress: Not on file  Social Connections: Not on file  Intimate Partner Violence: Not on file   Family History  Problem Relation Age of Onset   Heart disease Father        cardiomyopathy (HTN)   Hypertension Father    Hypertension Mother    Hypertension Sister    Hypertension Brother      Review of Systems  All other systems reviewed and are negative.     Objective:   Physical Exam Vitals reviewed.  Constitutional:      General: She is not in acute distress.    Appearance: She is well-developed. She is not diaphoretic.  HENT:     Head: Normocephalic and atraumatic.     Right Ear: External ear normal.     Left Ear: External ear normal.     Nose: Nose normal.     Mouth/Throat:     Pharynx: No oropharyngeal exudate.  Eyes:     General: No scleral icterus.       Right eye: No discharge.        Left eye: No discharge.     Conjunctiva/sclera: Conjunctivae normal.     Pupils: Pupils are equal, round, and reactive to light.  Neck:     Thyroid: No thyromegaly.     Vascular: No JVD.     Trachea: No tracheal deviation.  Cardiovascular:     Rate and Rhythm: Normal rate and regular rhythm.     Heart sounds: Normal heart sounds. No murmur heard.   No friction rub. No gallop.  Pulmonary:     Effort: Pulmonary effort is normal. No respiratory distress.      Breath sounds: Normal breath sounds. No stridor. No wheezing or rales.  Chest:     Chest wall: No tenderness.  Abdominal:     General: Bowel sounds are normal. There is no distension.     Palpations: Abdomen is soft. There is no mass.     Tenderness: There is no abdominal tenderness. There is no guarding or rebound.  Musculoskeletal:        General: No tenderness. Normal range of motion.     Cervical back: Normal range of motion and neck supple.  Lymphadenopathy:     Cervical: No cervical adenopathy.  Skin:    Coloration: Skin is not pale.     Findings: No erythema or rash.  Neurological:     Mental Status: She is alert and oriented to person, place, and time.     Cranial Nerves: No cranial nerve deficit.     Motor: No abnormal muscle tone.     Coordination: Coordination normal.     Deep Tendon Reflexes: Reflexes normal.  Psychiatric:        Behavior: Behavior normal.        Thought Content: Thought content normal.  Judgment: Judgment normal.          Assessment & Plan:  Ringworm  Onychomycosis  Benign essential HTN  Other hyperlipidemia Treat the patient with Lamisil 250 mg p.o. daily for 90 days.  Every 30 days I will check liver function test.  Treat her cholesterol with Crestor 5 mg a day and recheck lab work in 6 months.  Blood pressures well controlled.  Regular anticipatory guidance is provided

## 2020-10-21 ENCOUNTER — Telehealth: Payer: Self-pay

## 2020-10-21 NOTE — Telephone Encounter (Signed)
Pt called in stating that she has received a bill from [facility], and although she is a self pay pt, [facility] had informed her about a [employer] discount that was not applied to her account. She would like to know if she could get this discount on her bill. Please advise.   Cb#: [phone]

## 2020-10-27 NOTE — Telephone Encounter (Signed)
I have sent an email to [facility] at [place] to see if she could apply the [employer] discount for patient.  I have left vm for patient requesting a call back.

## 2020-11-05 NOTE — Telephone Encounter (Signed)
If patient returns my call please let her know that I have forwarded the bill to [facility] at [place] so they can apply the discount for the patient,.  Also let patient know that we have added Self Pay to her appt for labs next time but she can also mention to the lab tech that she is self pay that way we can make sure it is marked prior to it going out.  Thank you,

## 2020-11-11 ENCOUNTER — Telehealth: Payer: Self-pay | Admitting: Family Medicine

## 2020-11-11 NOTE — Telephone Encounter (Signed)
Patient called to dispute billing issue; states discount wasn't applied.  Please advise at [phone]

## 2020-11-13 NOTE — Telephone Encounter (Signed)
Please see previous message on this. I have sent the request to [facility] asking that they apply the discount to patient. I have left patient a message asking her to return my call.    Copied from previous phone note that is still open:  Note   If patient returns my call please let her know that I have forwarded the bill to [facility] at [place] so they can apply the discount for the patient,.   Also let patient know that we have added Self Pay to her appt for labs next time but she can also mention to the lab tech that she is self pay that way we can make sure it is marked prior to it going out.   Thank you,

## 2020-11-17 ENCOUNTER — Other Ambulatory Visit: Payer: Self-pay

## 2020-11-17 NOTE — Telephone Encounter (Signed)
Received call from patient regarding outstanding bill; patient wants to make sure it's not having an adverse affect on her credit. Please advise at [phone].

## 2020-11-23 ENCOUNTER — Telehealth: Payer: Self-pay

## 2020-11-23 NOTE — Telephone Encounter (Signed)
I have left a message for patient. As far as I know it has been placed on hold and I advised patient she would need to contact them directly for any additional information.

## 2020-11-23 NOTE — Telephone Encounter (Signed)
I have spoken with patient. She is aware that I have sent the request to [facility] and they should send her a new statement. I have also let her know that we have marked on her appt desk for the next time she comes in for lab work that we add self-pay to the appt desk as a friendly reminder to lab tech.  Patient thanked me for taken care of this and was advised to give a friendly reminder to the front when she makes any additional appts for labs. She verbalized understanding.

## 2020-11-23 NOTE — Telephone Encounter (Signed)
Pt called in wanting to speak with administrator about her [facility] bill being placed on hold. Pt would like for you to give her a call back on her cell phone to verify that this has been done.   Cb#: [phone]

## 2020-11-25 ENCOUNTER — Other Ambulatory Visit: Payer: Self-pay

## 2020-12-08 ENCOUNTER — Other Ambulatory Visit: Payer: Self-pay

## 2020-12-08 DIAGNOSIS — Z79899 Other long term (current) drug therapy: Secondary | ICD-10-CM

## 2020-12-08 DIAGNOSIS — B351 Tinea unguium: Secondary | ICD-10-CM

## 2020-12-09 LAB — COMPLETE METABOLIC PANEL WITH GFR
AG Ratio: 1.3 (calc) (ref 1.0–2.5)
ALT: 63 U/L — ABNORMAL HIGH (ref 6–29)
AST: 35 U/L (ref 10–35)
Albumin: 4.2 g/dL (ref 3.6–5.1)
Alkaline phosphatase (APISO): 56 U/L (ref 37–153)
BUN: 17 mg/dL (ref 7–25)
CO2: 32 mmol/L (ref 20–32)
Calcium: 9.8 mg/dL (ref 8.6–10.4)
Chloride: 102 mmol/L (ref 98–110)
Creat: 0.79 mg/dL (ref 0.50–1.05)
Globulin: 3.3 g/dL (calc) (ref 1.9–3.7)
Glucose, Bld: 107 mg/dL — ABNORMAL HIGH (ref 65–99)
Potassium: 4.4 mmol/L (ref 3.5–5.3)
Sodium: 141 mmol/L (ref 135–146)
Total Bilirubin: 0.4 mg/dL (ref 0.2–1.2)
Total Protein: 7.5 g/dL (ref 6.1–8.1)
eGFR: 84 mL/min/{1.73_m2} (ref >=60)

## 2020-12-15 ENCOUNTER — Telehealth: Payer: Self-pay

## 2020-12-15 NOTE — Telephone Encounter (Signed)
Pt called back regarding lab results, advised pt of her lab results , pt understood her results without any concerns.

## 2020-12-21 ENCOUNTER — Telehealth: Payer: Self-pay | Admitting: Family Medicine

## 2020-12-21 NOTE — Telephone Encounter (Signed)
Patient called to follow up on outstanding bill; still waiting for someone to follow up with her. Patient states she hasn't been contacted and doesn't want the bill to negatively affecting her credit. Patient is willing to assist in whatever way is necessary to expedite the process, and wants the name of the person who's able to resolve the issue. Patient also asked if her account has been placed on hold again. Please advise at [phone].

## 2020-12-23 ENCOUNTER — Telehealth: Payer: Self-pay | Admitting: Family Medicine

## 2020-12-23 NOTE — Telephone Encounter (Signed)
Received call from patient regarding discontinuation of terbinafine (LAMISIL) 250 MG tablet [[id number]] . Patient stated it was affecting her liver. To receive refund from pharmacy, provider has to call them to verify he instructed her to discontinue usage.   Pharmacy confirmed as   [id number] [place], [address] [place] [address]  Phone:  [phone]  Fax:  [phone]  DEA #:  [id number]  Please advise patient at [phone].

## 2020-12-28 NOTE — Telephone Encounter (Signed)
Patient just received a new lab bill; discount wasn't applied. Patient requesting  bill just received and bill for 9/13 be revised with discount.   In addition, patient stated previous bill from visit on 7/18 still hasn't been addressed. Patient requesting issues be addressed; stated [facility] told her this office has to address the issue. Patient stated she's willing to assist in any way needed. Also stated she will be calling every other day until issue is rectified.   Please advise at [phone]

## 2020-12-29 NOTE — Telephone Encounter (Signed)
Call placed to pharmacy.   Advised that prescription can only be refunded if prescribed in error.   Liver inflammation is a possible side effect, but medication was not prescribed in error.   Call placed to patient. [place].

## 2020-12-30 ENCOUNTER — Other Ambulatory Visit: Payer: Self-pay | Admitting: *Deleted

## 2020-12-30 MED ORDER — ROSUVASTATIN CALCIUM 5 MG PO TABS: 5.0000 mg | ORAL_TABLET | Freq: Every day | ORAL | 3 refills | Status: DC

## 2020-12-30 NOTE — Telephone Encounter (Signed)
Patient returned call and made aware.

## 2021-01-04 NOTE — Telephone Encounter (Signed)
I have sent another email to Jocelyn Lamer with [facility]. I have asked her to make sure she was receiving the discount from [facility] on her bills.   WE need to make sure when patient comes in that we are letting the lab technician know that she is self pay so that she can mark her claim as self pay.

## 2021-01-04 NOTE — Telephone Encounter (Signed)
Outbound call placed to patient to update. Patient stated she told the lab technician she's self pay. The technician told patient she would put a special stamp on her paperwork. Patient states there's still a disconnect somewhere since she's still having issues with billing. Please advise at [phone].

## 2021-06-14 ENCOUNTER — Other Ambulatory Visit: Payer: Self-pay | Admitting: Family Medicine

## 2021-06-15 ENCOUNTER — Other Ambulatory Visit: Payer: Self-pay

## 2021-06-24 ENCOUNTER — Encounter: Payer: Self-pay | Admitting: Family Medicine

## 2021-07-08 ENCOUNTER — Encounter: Payer: Self-pay | Admitting: Family Medicine

## 2021-07-29 ENCOUNTER — Encounter: Payer: Self-pay | Admitting: Family Medicine

## 2021-08-27 ENCOUNTER — Encounter: Payer: Self-pay | Admitting: Family Medicine

## 2021-09-14 ENCOUNTER — Other Ambulatory Visit: Payer: Self-pay

## 2021-09-14 ENCOUNTER — Ambulatory Visit (INDEPENDENT_AMBULATORY_CARE_PROVIDER_SITE_OTHER): Payer: Self-pay | Admitting: Family Medicine

## 2021-09-14 VITALS — BP 120/76 | HR 84 | Temp 98.8°F | Ht 65.0 in | Wt 173.0 lb

## 2021-09-14 DIAGNOSIS — Z780 Asymptomatic menopausal state: Secondary | ICD-10-CM

## 2021-09-14 DIAGNOSIS — I10 Essential (primary) hypertension: Secondary | ICD-10-CM

## 2021-09-14 DIAGNOSIS — E7800 Pure hypercholesterolemia, unspecified: Secondary | ICD-10-CM

## 2021-09-14 DIAGNOSIS — Z0000 Encounter for general adult medical examination without abnormal findings: Secondary | ICD-10-CM

## 2021-09-14 NOTE — Progress Notes (Signed)
Pt request refill on maxzide, however, pt still has 2 refills left.

## 2021-09-14 NOTE — Progress Notes (Signed)
Subjective:    Patient ID: Alison [name], female    DOB: [birth year], 66 y.o.   MRN: [id number]  Patient is a very pleasant 66 year old African-American female here today for physical exam.  She has a history of a hysterectomy.  She still sees her gynecologist who performs her mammograms.  She declines to allow me to schedule her for her mammogram and prefers to get this with her gynecologist.  She is due for a bone density test later this year.  She is not taking calcium.  She is not taking vitamin D.  She is due for Prevnar 20 after age 65.  She is due for the shingles vaccine.  She is due for the flu shot in the fall.  Otherwise she is doing well.  Her blood pressure is excellent Past Medical History:  Diagnosis Date   Allergy    Asthma    Bone spur 88   Left Knee   Colon polyps    Fibroadenoma of breast    Right Breast   Headache    Heel spur    Right Foot   Hypertension    Obese    Pneumonia 1990   Past Surgical History:  Procedure Laterality Date   BUNIONECTOMY Right 12/04/2014   '@[facility]'$    CESAREAN SECTION     MASS EXCISION Right 03/16/2015   Procedure: EXCISION OF RIGHT ANTERIOR SHOULDER SOFT TISSUE MASS;  Surgeon: Armandina Gemma, MD;  Location: [facility];  Service: General;  Laterality: Right;   VAGINAL HYSTERECTOMY     Fibroids   Current Outpatient Medications on File Prior to Visit  Medication Sig Dispense Refill   albuterol (PROAIR HFA) 108 (90 Base) MCG/ACT inhaler Inhale 2 puffs into the lungs every 4 (four) hours as needed for wheezing or shortness of breath. 1 each 1   Ascorbic Acid (VITAMIN C) 100 MG tablet Take 500 mg by mouth daily.     Biotin 1000 MCG CHEW Chew by mouth.     ELDERBERRY PO Take by mouth.     meloxicam (MOBIC) 15 MG tablet Take 1 tablet (15 mg total) by mouth daily. 30 tablet 1   rosuvastatin (CRESTOR) 5 MG tablet Take 1 tablet (5 mg total) by mouth daily. 90 tablet 3   tiZANidine (ZANAFLEX) 4 MG capsule Take 1 capsule (4 mg  total) by mouth 3 (three) times daily as needed for muscle spasms. 90 capsule 1   triamterene-hydrochlorothiazide (MAXZIDE-25) 37.5-25 MG tablet TAKE 1 TABLET BY MOUTH EVERY DAY 90 tablet 2   zolpidem (AMBIEN) 5 MG tablet Take 5 mg by mouth at bedtime as needed. For insomnia     No current facility-administered medications on file prior to visit.   Allergies  Allergen Reactions   Penicillin G Other (See Comments)   Penicillins Other (See Comments)    REACTION: as child   Social History   Socioeconomic History   Marital status: Unknown    Spouse name: Not on file   Number of children: 1   Years of education: Not on file   Highest education level: Not on file  Occupational History   Occupation: Haematologist: [employer]  Tobacco Use   Smoking status: Every Day    Packs/day: 0.50    Years: 11.00    Total pack years: 5.50    Types: Cigarettes   Smokeless tobacco: Never  Substance and Sexual Activity   Alcohol use: No    Alcohol/week: 0.0 standard  drinks of alcohol   Drug use: No   Sexual activity: Yes    Birth control/protection: None  Other Topics Concern   Not on file  Social History Narrative   Drinks one cup caffeine daily         Social Determinants of Health   Financial Resource Strain: Not on file  Food Insecurity: Not on file  Transportation Needs: Not on file  Physical Activity: Not on file  Stress: Not on file  Social Connections: Not on file  Intimate Partner Violence: Not on file   Family History  Problem Relation Age of Onset   Heart disease Father        cardiomyopathy (HTN)   Hypertension Father    Hypertension Mother    Hypertension Sister    Hypertension Brother      Review of Systems  All other systems reviewed and are negative.      Objective:   Physical Exam Vitals reviewed.  Constitutional:      General: She is not in acute distress.    Appearance: She is well-developed. She is not diaphoretic.   HENT:     Head: Normocephalic and atraumatic.     Right Ear: External ear normal.     Left Ear: External ear normal.     Nose: Nose normal.     Mouth/Throat:     Pharynx: No oropharyngeal exudate.  Eyes:     General: No scleral icterus.       Right eye: No discharge.        Left eye: No discharge.     Conjunctiva/sclera: Conjunctivae normal.     Pupils: Pupils are equal, round, and reactive to light.  Neck:     Thyroid: No thyromegaly.     Vascular: No JVD.     Trachea: No tracheal deviation.  Cardiovascular:     Rate and Rhythm: Normal rate and regular rhythm.     Heart sounds: Normal heart sounds. No murmur heard.    No friction rub. No gallop.  Pulmonary:     Effort: Pulmonary effort is normal. No respiratory distress.     Breath sounds: Normal breath sounds. No stridor. No wheezing or rales.  Chest:     Chest wall: No tenderness.  Abdominal:     General: Bowel sounds are normal. There is no distension.     Palpations: Abdomen is soft. There is no mass.     Tenderness: There is no abdominal tenderness. There is no guarding or rebound.  Musculoskeletal:        General: No tenderness. Normal range of motion.     Cervical back: Normal range of motion and neck supple.  Lymphadenopathy:     Cervical: No cervical adenopathy.  Skin:    Coloration: Skin is not pale.     Findings: No erythema or rash.  Neurological:     Mental Status: She is alert and oriented to person, place, and time.     Cranial Nerves: No cranial nerve deficit.     Motor: No abnormal muscle tone.     Coordination: Coordination normal.     Deep Tendon Reflexes: Reflexes normal.  Psychiatric:        Behavior: Behavior normal.        Thought Content: Thought content normal.        Judgment: Judgment normal.           Assessment & Plan:  Pure hypercholesterolemia - Plan: CBC with Differential/Platelet, Lipid panel, COMPLETE  METABOLIC PANEL WITH GFR  General medical exam - Plan: CBC with  Differential/Platelet, Lipid panel, COMPLETE METABOLIC PANEL WITH GFR  Benign essential HTN - Plan: CBC with Differential/Platelet, Lipid panel, COMPLETE METABOLIC PANEL WITH GFR  Postmenopausal estrogen deficiency - Plan: DG Bone Density  Blood pressure looks outstanding.  Check CBC CMP and a lipid panel at her earliest convenience.  Goal LDL cholesterol is less than 130.  Blood pressure today is at goal.  Recommend 1200 mg a day of calcium and 1000 units a day of vitamin D.  Recommended Prevnar 63 after age 74.  Recommended Shingrix and a flu shot.  Recommended a bone density test which I will schedule for her.  Recommended a mammogram but she would like to get this with her gynecologist.  Her colonoscopy is not due until 2026

## 2021-09-21 ENCOUNTER — Other Ambulatory Visit: Payer: Self-pay

## 2021-09-22 LAB — CBC WITH DIFFERENTIAL/PLATELET
Absolute Monocytes: 666 cells/uL (ref 200–950)
Basophils Absolute: 54 cells/uL (ref 0–200)
Basophils Relative: 0.6 %
Eosinophils Absolute: 423 cells/uL (ref 15–500)
Eosinophils Relative: 4.7 %
HCT: 40.2 % (ref 35.0–45.0)
Hemoglobin: 13.2 g/dL (ref 11.7–15.5)
Lymphs Abs: 4869 cells/uL — ABNORMAL HIGH (ref 850–3900)
MCH: 28.6 pg (ref 27.0–33.0)
MCHC: 32.8 g/dL (ref 32.0–36.0)
MCV: 87.2 fL (ref 80.0–100.0)
MPV: 10.3 fL (ref 7.5–12.5)
Monocytes Relative: 7.4 %
Neutro Abs: 2988 cells/uL (ref 1500–7800)
Neutrophils Relative %: 33.2 %
Platelets: 353 10*3/uL (ref 140–400)
RBC: 4.61 10*6/uL (ref 3.80–5.10)
RDW: 12.7 % (ref 11.0–15.0)
Total Lymphocyte: 54.1 %
WBC: 9 10*3/uL (ref 3.8–10.8)

## 2021-09-22 LAB — COMPLETE METABOLIC PANEL WITH GFR
AG Ratio: 1.3 (calc) (ref 1.0–2.5)
ALT: 15 U/L (ref 6–29)
AST: 18 U/L (ref 10–35)
Albumin: 4.1 g/dL (ref 3.6–5.1)
Alkaline phosphatase (APISO): 60 U/L (ref 37–153)
BUN: 13 mg/dL (ref 7–25)
CO2: 28 mmol/L (ref 20–32)
Calcium: 9.6 mg/dL (ref 8.6–10.4)
Chloride: 102 mmol/L (ref 98–110)
Creat: 0.74 mg/dL (ref 0.50–1.05)
Globulin: 3.1 g/dL (calc) (ref 1.9–3.7)
Glucose, Bld: 102 mg/dL — ABNORMAL HIGH (ref 65–99)
Potassium: 3.5 mmol/L (ref 3.5–5.3)
Sodium: 141 mmol/L (ref 135–146)
Total Bilirubin: 0.3 mg/dL (ref 0.2–1.2)
Total Protein: 7.2 g/dL (ref 6.1–8.1)
eGFR: 90 mL/min/{1.73_m2} (ref >=60)

## 2021-09-22 LAB — LIPID PANEL
Cholesterol: 225 mg/dL — ABNORMAL HIGH (ref <200)
HDL: 42 mg/dL — ABNORMAL LOW (ref >=50)
LDL Cholesterol (Calc): 132 mg/dL (calc) — ABNORMAL HIGH
Non-HDL Cholesterol (Calc): 183 mg/dL (calc) — ABNORMAL HIGH (ref <130)
Total CHOL/HDL Ratio: 5.4 (calc) — ABNORMAL HIGH (ref <5.0)
Triglycerides: 355 mg/dL — ABNORMAL HIGH (ref <150)

## 2021-09-29 ENCOUNTER — Other Ambulatory Visit: Payer: Self-pay

## 2021-09-29 MED ORDER — ROSUVASTATIN CALCIUM 5 MG PO TABS: 5.0000 mg | ORAL_TABLET | Freq: Every day | ORAL | 3 refills | Status: DC

## 2021-12-27 ENCOUNTER — Other Ambulatory Visit: Payer: Self-pay

## 2021-12-31 ENCOUNTER — Other Ambulatory Visit: Payer: Self-pay

## 2021-12-31 DIAGNOSIS — E7800 Pure hypercholesterolemia, unspecified: Secondary | ICD-10-CM

## 2022-01-01 LAB — LIPID PANEL
Cholesterol: 150 mg/dL (ref <200)
HDL: 52 mg/dL (ref >=50)
LDL Cholesterol (Calc): 78 mg/dL (calc)
Non-HDL Cholesterol (Calc): 98 mg/dL (calc) (ref <130)
Total CHOL/HDL Ratio: 2.9 (calc) (ref <5.0)
Triglycerides: 116 mg/dL (ref <150)

## 2022-01-13 LAB — HM MAMMOGRAPHY

## 2022-03-11 ENCOUNTER — Other Ambulatory Visit: Payer: Self-pay | Admitting: Family Medicine

## 2022-04-18 ENCOUNTER — Other Ambulatory Visit: Payer: Self-pay | Admitting: Family Medicine

## 2022-04-18 DIAGNOSIS — Z1231 Encounter for screening mammogram for malignant neoplasm of breast: Secondary | ICD-10-CM

## 2022-07-29 ENCOUNTER — Encounter: Payer: Self-pay | Admitting: Family Medicine

## 2022-08-08 ENCOUNTER — Ambulatory Visit: Payer: Medicare Other | Admitting: Podiatry

## 2022-08-08 ENCOUNTER — Encounter: Payer: Self-pay | Admitting: Podiatry

## 2022-08-08 DIAGNOSIS — B351 Tinea unguium: Secondary | ICD-10-CM

## 2022-08-08 DIAGNOSIS — D2371 Other benign neoplasm of skin of right lower limb, including hip: Secondary | ICD-10-CM

## 2022-08-08 NOTE — Progress Notes (Signed)
  Subjective:  Patient ID: Alison [name], female    DOB: 1955/07/28,   MRN: [id number]  Chief Complaint  Patient presents with   Callouses    Patient here for callouse on bilateral feet, ball of foot, lateral side and heel, also has foot fungus on bilateral feet    67 y.o. female presents for concern of lesion on the bottom of both of her feet and heel. Also has concern for possible toenail fungus. Has tried many OTC treatments without relief.  . Denies any other pedal complaints. Denies n/v/f/c.   Past Medical History:  Diagnosis Date   Allergy    Asthma    Bone spur 88   Left Knee   Colon polyps    Fibroadenoma of breast    Right Breast   Headache    Heel spur    Right Foot   Hypertension    Obese    Pneumonia 1990    Objective:  Physical Exam: Vascular: DP/PT pulses 2/4 bilateral. CFT <3 seconds. Normal hair growth on digits. No edema.  Skin. No lacerations or abrasions bilateral feet. Hyperkeratotic cored lesion noted to plantar lateral right foot and first metatarsal on right as well as central left heel. Some thickeneding and discoloration noted to distal digits 1-5 bilateral.  Musculoskeletal: MMT 5/5 bilateral lower extremities in DF, PF, Inversion and Eversion. Deceased ROM in DF of ankle joint.  Neurological: Sensation intact to light touch.   Assessment:   1. Onychomycosis   2. Benign neoplasm of skin of foot, right      Plan:  Patient was evaluated and treated and all questions answered. -Examined patient -Discussed treatment options for painful dystrophic nails  -Clinical picture and Fungal culture was obtained by removing a portion of the hard nail itself from each of the involved toenails using a sterile nail nipper and sent to [facility] lab. Patient tolerated the biopsy procedure well without discomfort or need for anesthesia.  -Discussed fungal nail treatment options including oral, topical, and laser treatments.  -Patient to return in 4 weeks for  follow up evaluation and discussion of fungal culture results or sooner if symptoms worsen. -Discussed  lesions of the skin with patient and treatment options.  -Hyperkeratotic tissue was debrided with chisel without incident.  -Applied salycylic acid treatment to area with dressing. Advised to remove bandaging tomorrow.  -Encouraged daily moisturizing -Discussed use of pumice stone -Advised good supportive shoes and inserts   Alison [name], DPM

## 2022-08-29 ENCOUNTER — Ambulatory Visit: Payer: Medicare Other | Admitting: Podiatry

## 2022-08-29 ENCOUNTER — Telehealth: Payer: Self-pay | Admitting: Family Medicine

## 2022-08-29 ENCOUNTER — Encounter: Payer: Self-pay | Admitting: Podiatry

## 2022-08-29 DIAGNOSIS — D2371 Other benign neoplasm of skin of right lower limb, including hip: Secondary | ICD-10-CM

## 2022-08-29 DIAGNOSIS — L608 Other nail disorders: Secondary | ICD-10-CM | POA: Diagnosis not present

## 2022-08-29 DIAGNOSIS — B351 Tinea unguium: Secondary | ICD-10-CM

## 2022-08-29 DIAGNOSIS — L603 Nail dystrophy: Secondary | ICD-10-CM | POA: Diagnosis not present

## 2022-08-29 DIAGNOSIS — L601 Onycholysis: Secondary | ICD-10-CM | POA: Diagnosis not present

## 2022-08-29 NOTE — Telephone Encounter (Signed)
Left message to return call to reschedule lab appt on 09/01/22; both providers on vacation.

## 2022-08-29 NOTE — Progress Notes (Signed)
  Subjective:  Patient ID: Alison [name], female    DOB: Dec 16, 1955,   MRN: [id number]  Chief Complaint  Patient presents with   Callouses    Check calluses bilateral    Results    BAKO fungal culture results    67 y.o. female presents for concern of new lesion on the bottom of both of her feet and heel. Also to review culture results for fungal toenails. . Denies any other pedal complaints. Denies n/v/f/c.   Past Medical History:  Diagnosis Date   Allergy    Asthma    Bone spur 88   Left Knee   Colon polyps    Fibroadenoma of breast    Right Breast   Headache    Heel spur    Right Foot   Hypertension    Obese    Pneumonia 1990    Objective:  Physical Exam: Vascular: DP/PT pulses 2/4 bilateral. CFT <3 seconds. Normal hair growth on digits. No edema.  Skin. No lacerations or abrasions bilateral feet. Hyperkeratotic cored lesion noted to plantar lateral right foot and first metatarsal on right as well as central left heel. New lesion to plantar right heel. Some thickeneding and discoloration noted to distal digits 1-5 bilateral.  Musculoskeletal: MMT 5/5 bilateral lower extremities in DF, PF, Inversion and Eversion. Deceased ROM in DF of ankle joint.  Neurological: Sensation intact to light touch.   Assessment:   No diagnosis found.    Plan:  Patient was evaluated and treated and all questions answered. -Examined patient -Discussed treatment options for painful dystrophic nails  -Clinical picture and Fungal culture was obtained by removing a portion of the hard nail itself from each of the involved toenails using a sterile nail nipper and sent to [facility] lab. Patient tolerated the biopsy procedure well without discomfort or need for anesthesia.  -Discussed fungal nail treatment options including oral, topical, and laser treatments.  -Patient to return in 4 weeks for follow up evaluation and discussion of fungal culture results or sooner if symptoms worsen. -Discussed   lesions of the skin with patient and treatment options.  -Hyperkeratotic tissue was debrided with chisel without incident.  -Applied salycylic acid treatment to area with dressing. Advised to remove bandaging tomorrow.  -Encouraged daily moisturizing -Discussed use of pumice stone -Advised good supportive shoes and inserts   Louann Sjogren, DPM

## 2022-08-31 ENCOUNTER — Other Ambulatory Visit: Payer: Medicare Other

## 2022-08-31 DIAGNOSIS — I10 Essential (primary) hypertension: Secondary | ICD-10-CM | POA: Diagnosis not present

## 2022-08-31 DIAGNOSIS — R5383 Other fatigue: Secondary | ICD-10-CM

## 2022-08-31 DIAGNOSIS — F17210 Nicotine dependence, cigarettes, uncomplicated: Secondary | ICD-10-CM | POA: Diagnosis not present

## 2022-08-31 DIAGNOSIS — E7800 Pure hypercholesterolemia, unspecified: Secondary | ICD-10-CM

## 2022-08-31 DIAGNOSIS — E559 Vitamin D deficiency, unspecified: Secondary | ICD-10-CM | POA: Diagnosis not present

## 2022-09-01 ENCOUNTER — Other Ambulatory Visit: Payer: Self-pay

## 2022-09-01 LAB — CBC WITH DIFFERENTIAL/PLATELET
Absolute Monocytes: 694 cells/uL (ref 200–950)
Basophils Absolute: 70 cells/uL (ref 0–200)
Basophils Relative: 0.9 %
Eosinophils Absolute: 554 cells/uL — ABNORMAL HIGH (ref 15–500)
Eosinophils Relative: 7.1 %
HCT: 39.6 % (ref 35.0–45.0)
Hemoglobin: 12.9 g/dL (ref 11.7–15.5)
Lymphs Abs: 4173 cells/uL — ABNORMAL HIGH (ref 850–3900)
MCH: 28.7 pg (ref 27.0–33.0)
MCHC: 32.6 g/dL (ref 32.0–36.0)
MCV: 88 fL (ref 80.0–100.0)
MPV: 10.4 fL (ref 7.5–12.5)
Monocytes Relative: 8.9 %
Neutro Abs: 2309 cells/uL (ref 1500–7800)
Neutrophils Relative %: 29.6 %
Platelets: 308 10*3/uL (ref 140–400)
RBC: 4.5 10*6/uL (ref 3.80–5.10)
RDW: 12.5 % (ref 11.0–15.0)
Total Lymphocyte: 53.5 %
WBC: 7.8 10*3/uL (ref 3.8–10.8)

## 2022-09-01 LAB — COMPLETE METABOLIC PANEL WITH GFR
AG Ratio: 1.6 (calc) (ref 1.0–2.5)
ALT: 23 U/L (ref 6–29)
AST: 24 U/L (ref 10–35)
Albumin: 4.4 g/dL (ref 3.6–5.1)
Alkaline phosphatase (APISO): 56 U/L (ref 37–153)
BUN: 14 mg/dL (ref 7–25)
CO2: 29 mmol/L (ref 20–32)
Calcium: 9.7 mg/dL (ref 8.6–10.4)
Chloride: 104 mmol/L (ref 98–110)
Creat: 0.76 mg/dL (ref 0.50–1.05)
Globulin: 2.8 g/dL (calc) (ref 1.9–3.7)
Glucose, Bld: 95 mg/dL (ref 65–99)
Potassium: 4.1 mmol/L (ref 3.5–5.3)
Sodium: 142 mmol/L (ref 135–146)
Total Bilirubin: 0.3 mg/dL (ref 0.2–1.2)
Total Protein: 7.2 g/dL (ref 6.1–8.1)
eGFR: 87 mL/min/{1.73_m2} (ref >=60)

## 2022-09-01 LAB — LIPID PANEL
Cholesterol: 137 mg/dL (ref <200)
HDL: 47 mg/dL — ABNORMAL LOW (ref >=50)
LDL Cholesterol (Calc): 64 mg/dL (calc)
Non-HDL Cholesterol (Calc): 90 mg/dL (calc) (ref <130)
Total CHOL/HDL Ratio: 2.9 (calc) (ref <5.0)
Triglycerides: 181 mg/dL — ABNORMAL HIGH (ref <150)

## 2022-09-01 LAB — TSH: TSH: 0.7 mIU/L (ref 0.40–4.50)

## 2022-09-01 LAB — VITAMIN D 25 HYDROXY (VIT D DEFICIENCY, FRACTURES): Vit D, 25-Hydroxy: 34 ng/mL (ref 30–100)

## 2022-09-02 ENCOUNTER — Encounter: Payer: Self-pay | Admitting: Family Medicine

## 2022-09-05 ENCOUNTER — Encounter: Payer: Self-pay | Admitting: Family Medicine

## 2022-09-05 ENCOUNTER — Ambulatory Visit (INDEPENDENT_AMBULATORY_CARE_PROVIDER_SITE_OTHER): Payer: Medicare Other | Admitting: Family Medicine

## 2022-09-05 VITALS — BP 124/68 | HR 79 | Temp 98.9°F | Ht 65.0 in | Wt 173.8 lb

## 2022-09-05 DIAGNOSIS — I10 Essential (primary) hypertension: Secondary | ICD-10-CM | POA: Diagnosis not present

## 2022-09-05 DIAGNOSIS — E7800 Pure hypercholesterolemia, unspecified: Secondary | ICD-10-CM

## 2022-09-05 DIAGNOSIS — Z780 Asymptomatic menopausal state: Secondary | ICD-10-CM

## 2022-09-05 DIAGNOSIS — Z0001 Encounter for general adult medical examination with abnormal findings: Secondary | ICD-10-CM

## 2022-09-05 DIAGNOSIS — Z0000 Encounter for general adult medical examination without abnormal findings: Secondary | ICD-10-CM

## 2022-09-05 DIAGNOSIS — N758 Other diseases of Bartholin's gland: Secondary | ICD-10-CM | POA: Insufficient documentation

## 2022-09-05 NOTE — Progress Notes (Signed)
Subjective:    Patient ID: Alison [name], female    DOB: Nov 02, 1955, 67 y.o.   MRN: [id number]  Patient is a very pleasant 67 year old African-American female here today for physical exam.  She has a history of a hysterectomy.  She still sees her gynecologist who performs her mammograms.  Mammogram was normal within the last 12 months.  She is due for a bone density test and she would like me to schedule that.  Her colonoscopy states is undetectable..  She has had Pneumovax 23.  She is due for Prevnar 20, Shingrix.  We also discussed RSV and COVID vaccinations.  She defers all shots today.  She does not want to continue her cholesterol medication.  We discussed a cardiac calcium score to determine if she has plaque buildup in her arteries as a way to risk stratify the patient.  She would like to proceed with this one-time test to see if there is any necessity in continuing her cholesterol medication.  Her most recent lab work is listed below Abstract on 09/02/2022  Component Date Value Ref Range Status   HM Mammogram 01/13/2022 0-4 Bi-Rad  0-4 Bi-Rad, Self Reported Normal Final  Lab on 08/31/2022  Component Date Value Ref Range Status   WBC 08/31/2022 7.8  3.8 - 10.8 Thousand/uL Final   RBC 08/31/2022 4.50  3.80 - 5.10 Million/uL Final   Hemoglobin 08/31/2022 12.9  11.7 - 15.5 g/dL Final   HCT 40/98/1191 39.6  35.0 - 45.0 % Final   MCV 08/31/2022 88.0  80.0 - 100.0 fL Final   MCH 08/31/2022 28.7  27.0 - 33.0 pg Final   MCHC 08/31/2022 32.6  32.0 - 36.0 g/dL Final   RDW 47/82/9562 12.5  11.0 - 15.0 % Final   Platelets 08/31/2022 308  140 - 400 Thousand/uL Final   MPV 08/31/2022 10.4  7.5 - 12.5 fL Final   Neutro Abs 08/31/2022 2,309  1,500 - 7,800 cells/uL Final   Lymphs Abs 08/31/2022 4,173 (H)  850 - 3,900 cells/uL Final   Absolute Monocytes 08/31/2022 694  200 - 950 cells/uL Final   Eosinophils Absolute 08/31/2022 554 (H)  15 - 500 cells/uL Final   Basophils Absolute 08/31/2022 70  0  - 200 cells/uL Final   Neutrophils Relative % 08/31/2022 29.6  % Final   Total Lymphocyte 08/31/2022 53.5  % Final   Monocytes Relative 08/31/2022 8.9  % Final   Eosinophils Relative 08/31/2022 7.1  % Final   Basophils Relative 08/31/2022 0.9  % Final   Glucose, Bld 08/31/2022 95  65 - 99 mg/dL Final   Comment: .            Fasting reference interval .    BUN 08/31/2022 14  7 - 25 mg/dL Final   Creat 13/10/6576 0.76  0.50 - 1.05 mg/dL Final   eGFR 46/96/2952 87  > OR = 60 mL/min/1.46m2 Final   BUN/Creatinine Ratio 08/31/2022 SEE NOTE:  6 - 22 (calc) Final   Comment:    Not Reported: BUN and Creatinine are within    reference range. .    Sodium 08/31/2022 142  135 - 146 mmol/L Final   Potassium 08/31/2022 4.1  3.5 - 5.3 mmol/L Final   Chloride 08/31/2022 104  98 - 110 mmol/L Final   CO2 08/31/2022 29  20 - 32 mmol/L Final   Calcium 08/31/2022 9.7  8.6 - 10.4 mg/dL Final   Total Protein 84/13/2440 7.2  6.1 - 8.1 g/dL Final  Albumin 08/31/2022 4.4  3.6 - 5.1 g/dL Final   Globulin 16/12/9602 2.8  1.9 - 3.7 g/dL (calc) Final   AG Ratio 08/31/2022 1.6  1.0 - 2.5 (calc) Final   Total Bilirubin 08/31/2022 0.3  0.2 - 1.2 mg/dL Final   Alkaline phosphatase (APISO) 08/31/2022 56  37 - 153 U/L Final   AST 08/31/2022 24  10 - 35 U/L Final   ALT 08/31/2022 23  6 - 29 U/L Final   Cholesterol 08/31/2022 137  <200 mg/dL Final   HDL 54/11/8117 47 (L)  > OR = 50 mg/dL Final   Triglycerides 14/78/2956 181 (H)  <150 mg/dL Final   LDL Cholesterol (Calc) 08/31/2022 64  mg/dL (calc) Final   Comment: Reference range: <100 . Desirable range <100 mg/dL for primary prevention;   <70 mg/dL for patients with CHD or diabetic patients  with > or = 2 CHD risk factors. Marland Kitchen LDL-C is now calculated using the Martin-Hopkins  calculation, which is a validated novel method providing  better accuracy than the Friedewald equation in the  estimation of LDL-C.  Horald Pollen et al. Lenox Ahr. 2130;865(78): 2061-2068   (http://education.[facility].com/faq/FAQ164)    Total CHOL/HDL Ratio 08/31/2022 2.9  <4.6 (calc) Final   Non-HDL Cholesterol (Calc) 08/31/2022 90  <130 mg/dL (calc) Final   Comment: For patients with diabetes plus 1 major ASCVD risk  factor, treating to a non-HDL-C goal of <100 mg/dL  (LDL-C of <96 mg/dL) is considered a therapeutic  option.    TSH 08/31/2022 0.70  0.40 - 4.50 mIU/L Final   Vit D, 25-Hydroxy 08/31/2022 34  30 - 100 ng/mL Final   Comment: Vitamin D Status         25-OH Vitamin D: . Deficiency:                    <20 ng/mL Insufficiency:             20 - 29 ng/mL Optimal:                 > or = 30 ng/mL . For 25-OH Vitamin D testing on patients on  D2-supplementation and patients for whom quantitation  of D2 and D3 fractions is required, the [facility]ureD(TM) 25-OH VIT D, (D2,D3), LC/MS/MS is recommended: order  code 29528 (patients >39yrs). . See Note 1 . Note 1 . For additional information, please refer to  http://education.[facility].com/faq/FAQ199  (This link is being provided for informational/ educational purposes only.)     Past Medical History:  Diagnosis Date   Allergy    Asthma    Bone spur 87   Left Knee   Colon polyps    Fibroadenoma of breast    Right Breast   Headache    Heel spur    Right Foot   Hypertension    Obese    Pneumonia 1990   Past Surgical History:  Procedure Laterality Date   BUNIONECTOMY Right 12/04/2014   @[facility]    CESAREAN SECTION     MASS EXCISION Right 03/16/2015   Procedure: EXCISION OF RIGHT ANTERIOR SHOULDER SOFT TISSUE MASS;  Surgeon: Darnell Level, MD;  Location: [facility];  Service: General;  Laterality: Right;   VAGINAL HYSTERECTOMY     Fibroids   Current Outpatient Medications on File Prior to Visit  Medication Sig Dispense Refill   albuterol (PROAIR HFA) 108 (90 Base) MCG/ACT inhaler Inhale 2 puffs into the lungs every 4 (four) hours as needed for wheezing or shortness of  breath.  1 each 1   Ascorbic Acid (VITAMIN C) 100 MG tablet Take 500 mg by mouth daily.     Biotin 1000 MCG CHEW Chew by mouth.     ELDERBERRY PO Take by mouth.     rosuvastatin (CRESTOR) 5 MG tablet Take 1 tablet (5 mg total) by mouth daily. 90 tablet 3   tiZANidine (ZANAFLEX) 4 MG capsule Take 1 capsule (4 mg total) by mouth 3 (three) times daily as needed for muscle spasms. 90 capsule 1   triamterene-hydrochlorothiazide (MAXZIDE-25) 37.5-25 MG tablet TAKE 1 TABLET BY MOUTH EVERY DAY 90 tablet 2   zolpidem (AMBIEN) 5 MG tablet Take 5 mg by mouth at bedtime as needed. For insomnia (Patient not taking: Reported on 09/05/2022)     No current facility-administered medications on file prior to visit.   Allergies  Allergen Reactions   Penicillins Other (See Comments)    REACTION: as child   Social History   Socioeconomic History   Marital status: Unknown    Spouse name: Not on file   Number of children: 1   Years of education: Not on file   Highest education level: Not on file  Occupational History   Occupation: Clinical research associate: [employer]  Tobacco Use   Smoking status: Every Day    Packs/day: 0.50    Years: 11.00    Additional pack years: 0.00    Total pack years: 5.50    Types: Cigarettes   Smokeless tobacco: Never  Substance and Sexual Activity   Alcohol use: No    Alcohol/week: 0.0 standard drinks of alcohol   Drug use: No   Sexual activity: Yes    Birth control/protection: None  Other Topics Concern   Not on file  Social History Narrative   Drinks one cup caffeine daily         Social Determinants of Health   Financial Resource Strain: Not on file  Food Insecurity: Not on file  Transportation Needs: Not on file  Physical Activity: Not on file  Stress: Not on file  Social Connections: Not on file  Intimate Partner Violence: Not on file   Family History  Problem Relation Age of Onset   Heart disease Father        cardiomyopathy (HTN)    Hypertension Father    Hypertension Mother    Hypertension Sister    Hypertension Brother      Review of Systems  All other systems reviewed and are negative.      Objective:   Physical Exam Vitals reviewed.  Constitutional:      General: She is not in acute distress.    Appearance: She is well-developed. She is not diaphoretic.  HENT:     Head: Normocephalic and atraumatic.     Right Ear: External ear normal.     Left Ear: External ear normal.     Nose: Nose normal.     Mouth/Throat:     Pharynx: No oropharyngeal exudate.  Eyes:     General: No scleral icterus.       Right eye: No discharge.        Left eye: No discharge.     Conjunctiva/sclera: Conjunctivae normal.     Pupils: Pupils are equal, round, and reactive to light.  Neck:     Thyroid: No thyromegaly.     Vascular: No JVD.     Trachea: No tracheal deviation.  Cardiovascular:     Rate and  Rhythm: Normal rate and regular rhythm.     Heart sounds: Normal heart sounds. No murmur heard.    No friction rub. No gallop.  Pulmonary:     Effort: Pulmonary effort is normal. No respiratory distress.     Breath sounds: Normal breath sounds. No stridor. No wheezing or rales.  Chest:     Chest wall: No tenderness.  Abdominal:     General: Bowel sounds are normal. There is no distension.     Palpations: Abdomen is soft. There is no mass.     Tenderness: There is no abdominal tenderness. There is no guarding or rebound.  Musculoskeletal:        General: No tenderness. Normal range of motion.     Cervical back: Normal range of motion and neck supple.  Lymphadenopathy:     Cervical: No cervical adenopathy.  Skin:    Coloration: Skin is not pale.     Findings: No erythema or rash.  Neurological:     Mental Status: She is alert and oriented to person, place, and time.     Cranial Nerves: No cranial nerve deficit.     Motor: No abnormal muscle tone.     Coordination: Coordination normal.     Deep Tendon Reflexes:  Reflexes normal.  Psychiatric:        Behavior: Behavior normal.        Thought Content: Thought content normal.        Judgment: Judgment normal.           Assessment & Plan:  Postmenopausal estrogen deficiency - Plan: DG Bone Density  Pure hypercholesterolemia - Plan: CT CARDIAC SCORING (SELF PAY ONLY)  General medical exam  Benign essential HTN Physical exam today is completely normal.  She experienced a mild echogenic patient I recommend trying again for been.  Her lab work is beautiful.  Her cholesterol is outstanding.  The patient would like to proceed with a CT cardiac score to determine if she would benefit from staying on the cholesterol medication.  If her coronary calcium score is low, she plans to stop the cholesterol medication.  Colonoscopy is due in 2026.  Patient declines Prevnar 20 and Shingrix today.  I will schedule the patient for a bone density test.

## 2022-09-20 ENCOUNTER — Ambulatory Visit (HOSPITAL_COMMUNITY): Payer: Medicare Other

## 2022-10-04 ENCOUNTER — Telehealth: Payer: Self-pay | Admitting: Podiatry

## 2022-10-04 NOTE — Telephone Encounter (Signed)
Thank you, I did call and left message for her and will see her tomorrow as well.

## 2022-10-04 NOTE — Telephone Encounter (Signed)
Pt left message on 7.5.2024 at 1111am upset because she is waiting on a call from Dr Ralene Cork with her test results from her toenail clippings. She went to an appt and they were not back and she paid the copay for the appt and did not get results as they were not in. She has been waiting over 2 days for a call.  Upon checking pt has appt scheduled 7.10 to see Dr Ralene Cork.

## 2022-10-05 ENCOUNTER — Ambulatory Visit: Payer: Medicare Other | Admitting: Podiatry

## 2022-10-24 ENCOUNTER — Ambulatory Visit (HOSPITAL_COMMUNITY)
Admission: RE | Admit: 2022-10-24 | Discharge: 2022-10-24 | Disposition: A | Discharge status: Home / Self Care | Payer: Self-pay | Source: Ambulatory Visit | Attending: Family Medicine | Admitting: Family Medicine

## 2022-10-24 ENCOUNTER — Encounter (HOSPITAL_COMMUNITY): Payer: Self-pay

## 2022-10-24 DIAGNOSIS — E7800 Pure hypercholesterolemia, unspecified: Secondary | ICD-10-CM | POA: Insufficient documentation

## 2022-10-28 DIAGNOSIS — H2513 Age-related nuclear cataract, bilateral: Secondary | ICD-10-CM | POA: Diagnosis not present

## 2022-10-28 DIAGNOSIS — Q141 Congenital malformation of retina: Secondary | ICD-10-CM | POA: Diagnosis not present

## 2022-10-28 DIAGNOSIS — H35341 Macular cyst, hole, or pseudohole, right eye: Secondary | ICD-10-CM | POA: Diagnosis not present

## 2022-10-28 DIAGNOSIS — H02885 Meibomian gland dysfunction left lower eyelid: Secondary | ICD-10-CM | POA: Diagnosis not present

## 2022-12-07 ENCOUNTER — Other Ambulatory Visit: Payer: Self-pay | Admitting: Family Medicine

## 2022-12-08 NOTE — Telephone Encounter (Signed)
Due to a system glitch the last office visit for this practice is not detected correctly.    LOV 09/05/2022.   Labs in date  Requested Prescriptions  Pending Prescriptions Disp Refills   triamterene-hydrochlorothiazide (MAXZIDE-25) 37.5-25 MG tablet [Pharmacy Med Name: TRIAMTERENE-HCTZ 37.5-25 MG TB] 90 tablet 2    Sig: TAKE 1 TABLET BY MOUTH EVERY DAY     Cardiovascular: Diuretic Combos Failed - 12/07/2022  9:35 AM      Failed - Valid encounter within last 6 months    Recent Outpatient Visits           2 years ago Ringworm   [facility] Pickard, Priscille Heidelberg, MD   2 years ago Need for vaccination   [facility] Donita Brooks, MD   3 years ago General medical exam   [facility] Donita Brooks, MD   4 years ago Routine general medical examination at a health care facility   [facility] Pickard, Priscille Heidelberg, MD   5 years ago Essential hypertension   [facility] Tanya Nones, Priscille Heidelberg, MD              Passed - K in normal range and within 180 days    Potassium  Date Value Ref Range Status  08/31/2022 4.1 3.5 - 5.3 mmol/L Final         Passed - Na in normal range and within 180 days    Sodium  Date Value Ref Range Status  08/31/2022 142 135 - 146 mmol/L Final         Passed - Cr in normal range and within 180 days    Creat  Date Value Ref Range Status  08/31/2022 0.76 0.50 - 1.05 mg/dL Final         Passed - Last BP in normal range    BP Readings from Last 1 Encounters:  09/05/22 124/68

## 2023-01-16 ENCOUNTER — Ambulatory Visit: Payer: Medicare Other | Admitting: Family Medicine

## 2023-01-16 ENCOUNTER — Encounter: Payer: Self-pay | Admitting: Family Medicine

## 2023-01-16 VITALS — BP 118/68 | HR 98 | Temp 97.7°F | Ht 65.0 in | Wt 168.0 lb

## 2023-01-16 DIAGNOSIS — B9689 Other specified bacterial agents as the cause of diseases classified elsewhere: Secondary | ICD-10-CM | POA: Diagnosis not present

## 2023-01-16 DIAGNOSIS — J019 Acute sinusitis, unspecified: Secondary | ICD-10-CM

## 2023-01-16 MED ORDER — AZITHROMYCIN 250 MG PO TABS: ORAL_TABLET | ORAL | 0 refills | Status: DC

## 2023-01-16 MED ORDER — PREDNISONE 20 MG PO TABS
ORAL_TABLET | ORAL | 0 refills | Status: DC
Start: 2023-01-16 — End: 2024-01-11

## 2023-01-16 MED ORDER — ZOLPIDEM TARTRATE 5 MG PO TABS: 5.0000 mg | ORAL_TABLET | Freq: Every evening | ORAL | 1 refills | Status: DC | PRN

## 2023-01-16 NOTE — Progress Notes (Signed)
Subjective:    Patient ID: Alison [name], female    DOB: 13-May-1955, 67 y.o.   MRN: [id number] Patient is a 67 year old African-American female who has been dealing with left maxillary sinus pain and pressure for 4 weeks.  She states that her symptoms began with a flulike illness 4 weeks ago.  She developed copious rhinorrhea.  She then developed pain and pressure in her left maxillary sinus.  She made the appointment after the pain worsened over the weekend.  She complains of pain in her upper teeth.  She reports milky purulent nasal discharge when she blows her nose.  She reports a dull headache particularly in her face.  She reports swelling underneath her eye.  She denies any fevers or chills.  Past Medical History:  Diagnosis Date   Allergy    Asthma    Bone spur 88   Left Knee   Colon polyps    Fibroadenoma of breast    Right Breast   Headache    Heel spur    Right Foot   Hypertension    Obese    Pneumonia 1990   Past Surgical History:  Procedure Laterality Date   BUNIONECTOMY Right 12/04/2014   @[facility]    CESAREAN SECTION     MASS EXCISION Right 03/16/2015   Procedure: EXCISION OF RIGHT ANTERIOR SHOULDER SOFT TISSUE MASS;  Surgeon: Darnell Level, MD;  Location: [facility];  Service: General;  Laterality: Right;   VAGINAL HYSTERECTOMY     Fibroids   Current Outpatient Medications on File Prior to Visit  Medication Sig Dispense Refill   albuterol (PROAIR HFA) 108 (90 Base) MCG/ACT inhaler Inhale 2 puffs into the lungs every 4 (four) hours as needed for wheezing or shortness of breath. 1 each 1   Ascorbic Acid (VITAMIN C) 100 MG tablet Take 500 mg by mouth daily.     Biotin 1000 MCG CHEW Chew by mouth.     ELDERBERRY PO Take by mouth.     tiZANidine (ZANAFLEX) 4 MG capsule Take 1 capsule (4 mg total) by mouth 3 (three) times daily as needed for muscle spasms. 90 capsule 1   triamterene-hydrochlorothiazide (MAXZIDE-25) 37.5-25 MG tablet TAKE 1 TABLET BY MOUTH  EVERY DAY 90 tablet 2   No current facility-administered medications on file prior to visit.   Allergies  Allergen Reactions   Penicillins Other (See Comments)    REACTION: as child   Social History   Socioeconomic History   Marital status: Unknown    Spouse name: Not on file   Number of children: 1   Years of education: Not on file   Highest education level: Not on file  Occupational History   Occupation: Clinical research associate: [employer]  Tobacco Use   Smoking status: Every Day    Current packs/day: 0.50    Average packs/day: 0.5 packs/day for 11.0 years (5.5 ttl pk-yrs)    Types: Cigarettes   Smokeless tobacco: Never  Substance and Sexual Activity   Alcohol use: No    Alcohol/week: 0.0 standard drinks of alcohol   Drug use: No   Sexual activity: Yes    Birth control/protection: None  Other Topics Concern   Not on file  Social History Narrative   Drinks one cup caffeine daily         Social Determinants of Health   Financial Resource Strain: Medium Risk (01/16/2023)   Overall Financial Resource Strain (CARDIA)    Difficulty of Paying  Living Expenses: Somewhat hard  Food Insecurity: Food Insecurity Present (01/16/2023)   Hunger Vital Sign    Worried About Running Out of Food in the Last Year: Often true    Ran Out of Food in the Last Year: Sometimes true  Transportation Needs: No Transportation Needs (01/16/2023)   PRAPARE - Administrator, Civil Service (Medical): No    Lack of Transportation (Non-Medical): No  Physical Activity: Insufficiently Active (01/16/2023)   Exercise Vital Sign    Days of Exercise per Week: 4 days    Minutes of Exercise per Session: 30 min  Stress: Stress Concern Present (01/16/2023)   [employer] of Occupational Health - Occupational Stress Questionnaire    Feeling of Stress : To some extent  Social Connections: Unknown (01/16/2023)   Social Connection and Isolation Panel [NHANES]     Frequency of Communication with Friends and Family: More than three times a week    Frequency of Social Gatherings with Friends and Family: More than three times a week    Attends Religious Services: More than 4 times per year    Active Member of [employer] or Organizations: Yes    Attends Engineer, structural: More than 4 times per year    Marital Status: Patient declined  Catering manager Violence: Not on file   Family History  Problem Relation Age of Onset   Heart disease Father        cardiomyopathy (HTN)   Hypertension Father    Hypertension Mother    Hypertension Sister    Hypertension Brother      Review of Systems  All other systems reviewed and are negative.      Objective:   Physical Exam Vitals reviewed.  Constitutional:      General: She is not in acute distress.    Appearance: She is well-developed. She is not diaphoretic.  HENT:     Head: Normocephalic and atraumatic.     Right Ear: Tympanic membrane, ear canal and external ear normal.     Left Ear: Tympanic membrane, ear canal and external ear normal.     Nose: Congestion and rhinorrhea present.     Right Sinus: No maxillary sinus tenderness.     Left Sinus: Maxillary sinus tenderness present.     Mouth/Throat:     Pharynx: No oropharyngeal exudate.  Eyes:     General: No scleral icterus.       Right eye: No discharge.        Left eye: No discharge.     Conjunctiva/sclera: Conjunctivae normal.     Pupils: Pupils are equal, round, and reactive to light.  Neck:     Thyroid: No thyromegaly.     Vascular: No JVD.     Trachea: No tracheal deviation.  Cardiovascular:     Rate and Rhythm: Normal rate and regular rhythm.     Heart sounds: Normal heart sounds. No murmur heard.    No friction rub. No gallop.  Pulmonary:     Effort: Pulmonary effort is normal. No respiratory distress.     Breath sounds: Normal breath sounds. No stridor. No wheezing or rales.  Chest:     Chest wall: No tenderness.   Musculoskeletal:     Cervical back: Neck supple.  Lymphadenopathy:     Cervical: No cervical adenopathy.  Neurological:     Mental Status: She is alert.     Motor: No abnormal muscle tone.  Assessment & Plan:  Acute bacterial rhinosinusitis Patient appears to have developed a secondary sinus infection.  Begin a Z-Pak due to her penicillin allergy.  Use prednisone taper pack to lessen the swelling in her nasal mucosa and facilitate drainage.  Continue nasal saline rinses

## 2023-01-18 DIAGNOSIS — Z1211 Encounter for screening for malignant neoplasm of colon: Secondary | ICD-10-CM | POA: Diagnosis not present

## 2023-01-18 DIAGNOSIS — Z1231 Encounter for screening mammogram for malignant neoplasm of breast: Secondary | ICD-10-CM | POA: Diagnosis not present

## 2023-01-18 DIAGNOSIS — Z13820 Encounter for screening for osteoporosis: Secondary | ICD-10-CM | POA: Diagnosis not present

## 2023-01-18 DIAGNOSIS — J45909 Unspecified asthma, uncomplicated: Secondary | ICD-10-CM | POA: Diagnosis not present

## 2023-01-18 DIAGNOSIS — Z139 Encounter for screening, unspecified: Secondary | ICD-10-CM | POA: Diagnosis not present

## 2023-02-07 ENCOUNTER — Encounter: Payer: Self-pay | Admitting: Family Medicine

## 2023-02-07 ENCOUNTER — Ambulatory Visit: Payer: Medicare Other | Admitting: Family Medicine

## 2023-02-07 VITALS — BP 120/72 | HR 112 | Temp 97.5°F | Ht 65.0 in | Wt 172.0 lb

## 2023-02-07 DIAGNOSIS — J018 Other acute sinusitis: Secondary | ICD-10-CM

## 2023-02-07 MED ORDER — LEVOFLOXACIN 500 MG PO TABS
500.0000 mg | ORAL_TABLET | Freq: Every day | ORAL | 0 refills | Status: AC
Start: 2023-02-07 — End: 2023-02-14

## 2023-02-07 MED ORDER — FLUTICASONE PROPIONATE 50 MCG/ACT NA SUSP: 2.0000 | Freq: Every day | NASAL | 6 refills | Status: AC

## 2023-02-07 NOTE — Progress Notes (Signed)
Subjective:    Patient ID: Alison [name], female    DOB: December 06, 1955, 67 y.o.   MRN: [id number] 01/16/23 Patient is a 67 year old African-American female who has been dealing with left maxillary sinus pain and pressure for 4 weeks.  She states that her symptoms began with a flulike illness 4 weeks ago.  She developed copious rhinorrhea.  She then developed pain and pressure in her left maxillary sinus.  She made the appointment after the pain worsened over the weekend.  She complains of pain in her upper teeth.  She reports milky purulent nasal discharge when she blows her nose.  She reports a dull headache particularly in her face.  She reports swelling underneath her eye.  She denies any fevers or chills.  AT that time my plan was: Patient appears to have developed a secondary sinus infection.  Begin a Z-Pak due to her penicillin allergy.  Use prednisone taper pack to lessen the swelling in her nasal mucosa and facilitate drainage.  Continue nasal saline rinses  02/07/23 Patient states that she feels better.  She was doing well for the last 2 weeks however over the last few days she is starting to develop drainage again coming from her left nostril.  She continues to have pressure primarily in the mornings in the left maxillary sinus.  She is constantly having to clear her nose in the morning.  Today on exam she has an occluded right nostril due to an enlarged turbinate but otherwise the remainder of her exam is normal.  Past Medical History:  Diagnosis Date   Allergy    Asthma    Bone spur 87   Left Knee   Colon polyps    Fibroadenoma of breast    Right Breast   Headache    Heel spur    Right Foot   Hypertension    Obese    Pneumonia 1990   Past Surgical History:  Procedure Laterality Date   BUNIONECTOMY Right 12/04/2014   @[facility]    CESAREAN SECTION     MASS EXCISION Right 03/16/2015   Procedure: EXCISION OF RIGHT ANTERIOR SHOULDER SOFT TISSUE MASS;  Surgeon: Darnell Level, MD;   Location: [facility];  Service: General;  Laterality: Right;   VAGINAL HYSTERECTOMY     Fibroids   Current Outpatient Medications on File Prior to Visit  Medication Sig Dispense Refill   albuterol (PROAIR HFA) 108 (90 Base) MCG/ACT inhaler Inhale 2 puffs into the lungs every 4 (four) hours as needed for wheezing or shortness of breath. 1 each 1   Ascorbic Acid (VITAMIN C) 100 MG tablet Take 500 mg by mouth daily.     azithromycin (ZITHROMAX) 250 MG tablet 2 tabs poqday1, 1 tab poqday 2-5 6 tablet 0   Biotin 1000 MCG CHEW Chew by mouth.     ELDERBERRY PO Take by mouth.     predniSONE (DELTASONE) 20 MG tablet 3 tabs poqday 1-2, 2 tabs poqday 3-4, 1 tab poqday 5-6 12 tablet 0   tiZANidine (ZANAFLEX) 4 MG capsule Take 1 capsule (4 mg total) by mouth 3 (three) times daily as needed for muscle spasms. 90 capsule 1   triamterene-hydrochlorothiazide (MAXZIDE-25) 37.5-25 MG tablet TAKE 1 TABLET BY MOUTH EVERY DAY 90 tablet 2   zolpidem (AMBIEN) 5 MG tablet Take 1 tablet (5 mg total) by mouth at bedtime as needed. For insomnia 30 tablet 1   No current facility-administered medications on file prior to visit.   Allergies  Allergen  Reactions   Penicillins Other (See Comments)    REACTION: as child   Social History   Socioeconomic History   Marital status: Unknown    Spouse name: Not on file   Number of children: 1   Years of education: Not on file   Highest education level: Not on file  Occupational History   Occupation: Clinical research associate: [employer]  Tobacco Use   Smoking status: Every Day    Current packs/day: 0.50    Average packs/day: 0.5 packs/day for 11.0 years (5.5 ttl pk-yrs)    Types: Cigarettes   Smokeless tobacco: Never  Substance and Sexual Activity   Alcohol use: No    Alcohol/week: 0.0 standard drinks of alcohol   Drug use: No   Sexual activity: Yes    Birth control/protection: None  Other Topics Concern   Not on file  Social  History Narrative   Drinks one cup caffeine daily         Social Determinants of Health   Financial Resource Strain: Medium Risk (01/16/2023)   Overall Financial Resource Strain (CARDIA)    Difficulty of Paying Living Expenses: Somewhat hard  Food Insecurity: Food Insecurity Present (01/16/2023)   Hunger Vital Sign    Worried About Running Out of Food in the Last Year: Often true    Ran Out of Food in the Last Year: Sometimes true  Transportation Needs: No Transportation Needs (01/16/2023)   PRAPARE - Administrator, Civil Service (Medical): No    Lack of Transportation (Non-Medical): No  Physical Activity: Insufficiently Active (01/16/2023)   Exercise Vital Sign    Days of Exercise per Week: 4 days    Minutes of Exercise per Session: 30 min  Stress: Stress Concern Present (01/16/2023)   [employer] of Occupational Health - Occupational Stress Questionnaire    Feeling of Stress : To some extent  Social Connections: Unknown (01/16/2023)   Social Connection and Isolation Panel [NHANES]    Frequency of Communication with Friends and Family: More than three times a week    Frequency of Social Gatherings with Friends and Family: More than three times a week    Attends Religious Services: More than 4 times per year    Active Member of [employer] or Organizations: Yes    Attends Engineer, structural: More than 4 times per year    Marital Status: Patient declined  Catering manager Violence: Not on file   Family History  Problem Relation Age of Onset   Heart disease Father        cardiomyopathy (HTN)   Hypertension Father    Hypertension Mother    Hypertension Sister    Hypertension Brother      Review of Systems  All other systems reviewed and are negative.      Objective:   Physical Exam Vitals reviewed.  Constitutional:      General: She is not in acute distress.    Appearance: She is well-developed. She is not diaphoretic.  HENT:     Head:  Normocephalic and atraumatic.     Right Ear: Tympanic membrane, ear canal and external ear normal.     Left Ear: Tympanic membrane, ear canal and external ear normal.     Nose: Congestion and rhinorrhea present.     Right Turbinates: Enlarged and swollen.     Right Sinus: No maxillary sinus tenderness or frontal sinus tenderness.     Left Sinus: No maxillary sinus  tenderness or frontal sinus tenderness.     Mouth/Throat:     Pharynx: No oropharyngeal exudate.  Eyes:     General: No scleral icterus.       Right eye: No discharge.        Left eye: No discharge.     Conjunctiva/sclera: Conjunctivae normal.     Pupils: Pupils are equal, round, and reactive to light.  Neck:     Thyroid: No thyromegaly.     Vascular: No JVD.     Trachea: No tracheal deviation.  Cardiovascular:     Rate and Rhythm: Normal rate and regular rhythm.     Heart sounds: Normal heart sounds. No murmur heard.    No friction rub. No gallop.  Pulmonary:     Effort: Pulmonary effort is normal. No respiratory distress.     Breath sounds: Normal breath sounds. No stridor. No wheezing or rales.  Chest:     Chest wall: No tenderness.  Musculoskeletal:     Cervical back: Neck supple.  Lymphadenopathy:     Cervical: No cervical adenopathy.  Neurological:     Mental Status: She is alert.     Motor: No abnormal muscle tone.           Assessment & Plan:  Other subacute sinusitis I believe the patient may have some residual swelling and sinusitis versus an incompletely treated infection.  Will going to start the patient on Flonase 2 sprays each nostril daily and allow 1 week for symptoms to improve.  If symptoms worsen or if they are not 100% better at the end of 1 week she is going to start Levaquin 500 mg daily for 7 days

## 2023-03-10 ENCOUNTER — Encounter: Payer: Self-pay | Admitting: Family Medicine

## 2023-03-10 ENCOUNTER — Ambulatory Visit (INDEPENDENT_AMBULATORY_CARE_PROVIDER_SITE_OTHER): Payer: Medicare Other | Admitting: Family Medicine

## 2023-03-10 VITALS — BP 114/68 | HR 78 | Temp 98.6°F | Ht 65.0 in | Wt 166.2 lb

## 2023-03-10 DIAGNOSIS — Z0000 Encounter for general adult medical examination without abnormal findings: Secondary | ICD-10-CM

## 2023-03-10 DIAGNOSIS — Z23 Encounter for immunization: Secondary | ICD-10-CM

## 2023-03-10 NOTE — Progress Notes (Signed)
Subjective:    Alison [name] is a 67 y.o. female who presents for a Welcome to Medicare exam.   Cardiac Risk Factors include: hypertension  Patient is due for a flu shot but she declines this today.  She is due for the shingles shot which she agrees to.  June 2026 she had CMP, CBC, fasting with panel.  Reviewed his lab work with her.  The labs are excellent and I see no reason to repeat that today.  She denies any depression to me.  She denies any falls.  She denies any memory loss.  Mammogram was performed at her gynecologist office.  Colonoscopy is due again in 2026.  Pap smear is up-to-date through her gynecologist.  She has scheduled a bone density test.     Objective:    Today's Vitals   03/10/23 1117  BP: 114/68  Pulse: 78  Temp: 98.6 F (37 C)  SpO2: 99%  Weight: 166 lb 4 oz (75.4 kg)  Height: 5\' 5"  (1.651 m)  Body mass index is 27.67 kg/m.  Medications Outpatient Encounter Medications as of 03/10/2023  Medication Sig   albuterol (PROAIR HFA) 108 (90 Base) MCG/ACT inhaler Inhale 2 puffs into the lungs every 4 (four) hours as needed for wheezing or shortness of breath.   Ascorbic Acid (VITAMIN C) 100 MG tablet Take 500 mg by mouth daily.   azithromycin (ZITHROMAX) 250 MG tablet 2 tabs poqday1, 1 tab poqday 2-5 (Patient not taking: Reported on 02/07/2023)   Biotin 1000 MCG CHEW Chew by mouth.   ELDERBERRY PO Take by mouth.   fluticasone (FLONASE) 50 MCG/ACT nasal spray Place 2 sprays into both nostrils daily.   predniSONE (DELTASONE) 20 MG tablet 3 tabs poqday 1-2, 2 tabs poqday 3-4, 1 tab poqday 5-6 (Patient not taking: Reported on 03/10/2023)   tiZANidine (ZANAFLEX) 4 MG capsule Take 1 capsule (4 mg total) by mouth 3 (three) times daily as needed for muscle spasms.   triamterene-hydrochlorothiazide (MAXZIDE-25) 37.5-25 MG tablet TAKE 1 TABLET BY MOUTH EVERY DAY   zolpidem (AMBIEN) 5 MG tablet Take 1 tablet (5 mg total) by mouth at bedtime as needed. For insomnia    No facility-administered encounter medications on file as of 03/10/2023.     History: Past Medical History:  Diagnosis Date   Allergy    Asthma    Bone spur 87   Left Knee   Colon polyps    Fibroadenoma of breast    Right Breast   Headache    Heel spur    Right Foot   Hypertension    Obese    Pneumonia 1990   Past Surgical History:  Procedure Laterality Date   BUNIONECTOMY Right 12/04/2014   @[facility]    CESAREAN SECTION     MASS EXCISION Right 03/16/2015   Procedure: EXCISION OF RIGHT ANTERIOR SHOULDER SOFT TISSUE MASS;  Surgeon: Darnell Level, MD;  Location: [facility];  Service: General;  Laterality: Right;   VAGINAL HYSTERECTOMY     Fibroids    Family History  Problem Relation Age of Onset   Heart disease Father        cardiomyopathy (HTN)   Hypertension Father    Hypertension Mother    Hypertension Sister    Hypertension Brother    Social History   Occupational History   Occupation: Clinical research associate: [employer]  Tobacco Use   Smoking status: Former    Current packs/day: 0.50    Average  packs/day: 0.5 packs/day for 11.0 years (5.5 ttl pk-yrs)    Types: Cigarettes   Smokeless tobacco: Never  Vaping Use   Vaping status: Never Used  Substance and Sexual Activity   Alcohol use: No    Alcohol/week: 0.0 standard drinks of alcohol   Drug use: No   Sexual activity: Yes    Birth control/protection: None    Tobacco Counseling Counseling given: Not Answered   Immunizations and Health Maintenance Immunization History  Administered Date(s) Administered   PFIZER(Purple Top)SARS-COV-2 Vaccination 06/09/2019, 07/01/2019, 12/06/2019, 09/16/2020   Pneumococcal Polysaccharide-23 04/23/2020   Pneumococcal-Unspecified 04/23/2020   Td 03/28/2001, 05/15/2011   Unspecified SARS-COV-2 Vaccination 06/27/2019, 07/26/2019, 10/27/2019   Health Maintenance Due  Topic Date Due   DEXA SCAN  Never done    Activities of Daily  Living    03/10/2023   11:23 AM  In your present state of health, do you have any difficulty performing the following activities:  Hearing? 0  Vision? 1  Comment Has difficulty driving at night- no longer drives at night.  Difficulty concentrating or making decisions? 0  Walking or climbing stairs? 0  Dressing or bathing? 0  Doing errands, shopping? 0  Preparing Food and eating ? N  Using the Toilet? N  In the past six months, have you accidently leaked urine? N  Do you have problems with loss of bowel control? N  Managing your Medications? N  Managing your Finances? N  Housekeeping or managing your Housekeeping? N    Physical Exam   Physical Exam Vitals reviewed.  Constitutional:      General: She is not in acute distress.    Appearance: Normal appearance. She is normal weight. She is not ill-appearing, toxic-appearing or diaphoretic.  HENT:     Head: Normocephalic and atraumatic.     Right Ear: Tympanic membrane and ear canal normal.     Left Ear: Tympanic membrane and ear canal normal.     Nose: Congestion present. No rhinorrhea.     Mouth/Throat:     Mouth: Mucous membranes are moist.     Pharynx: Oropharynx is clear. No oropharyngeal exudate or posterior oropharyngeal erythema.  Eyes:     General:        Right eye: No discharge.        Left eye: No discharge.     Extraocular Movements: Extraocular movements intact.     Conjunctiva/sclera: Conjunctivae normal.     Pupils: Pupils are equal, round, and reactive to light.  Neck:     Vascular: No carotid bruit.  Cardiovascular:     Rate and Rhythm: Normal rate and regular rhythm.     Pulses: Normal pulses.     Heart sounds: Normal heart sounds. No murmur heard.    No friction rub. No gallop.  Pulmonary:     Effort: Pulmonary effort is normal. No respiratory distress.     Breath sounds: Normal breath sounds. No wheezing, rhonchi or rales.  Abdominal:     General: Bowel sounds are normal. There is no distension.      Palpations: Abdomen is soft.     Tenderness: There is no abdominal tenderness. There is no guarding or rebound.  Musculoskeletal:     Cervical back: Normal range of motion and neck supple. No rigidity.     Right lower leg: No edema.     Left lower leg: No edema.  Lymphadenopathy:     Cervical: No cervical adenopathy.  Skin:    General: Skin is  warm.     Coloration: Skin is not jaundiced or pale.     Findings: No bruising, erythema or rash.  Neurological:     General: No focal deficit present.     Mental Status: She is alert and oriented to person, place, and time. Mental status is at baseline.     Cranial Nerves: No cranial nerve deficit.     Sensory: No sensory deficit.     Motor: No weakness.     Coordination: Coordination normal.     Gait: Gait normal.  Psychiatric:        Mood and Affect: Mood normal.        Behavior: Behavior normal.        Thought Content: Thought content normal.        Judgment: Judgment normal.    (optional), or other factors deemed appropriate based on the beneficiary's medical and social history and current clinical standards.   Advanced Directives:        Assessment:    This is a routine wellness examination for this patient Gentrie Burke-Judd   Vision/Hearing screen Hearing Screening - Comments:: No hearing issues Vision Screening - Comments:: No vision issues except at night but she has stopped driving at night    Goals      DIET - REDUCE SUGAR INTAKE     Pt would like to continue losing weight and controlling sugar intake.        Depression Screen    03/10/2023   11:33 AM 09/05/2022    2:59 PM 09/14/2021    8:45 AM 10/02/2020   10:16 AM  PHQ 2/9 Scores  PHQ - 2 Score 2 0 0 0  PHQ- 9 Score 6  3      Fall Risk    03/10/2023   11:36 AM  Fall Risk   Falls in the past year? 0  Number falls in past yr: 0  Injury with Fall? 0    Cognitive Function:        03/10/2023   11:37 AM  6CIT Screen  What Year? 0 points  What  month? 0 points  What time? 0 points  Count back from 20 0 points  Months in reverse 0 points  Repeat phrase 2 points  Total Score 2 points    Patient Care Team: Donita Brooks, MD as PCP - General (Family Medicine)     Plan:     I have personally reviewed and noted the following in the patient's chart:   Medical and social history Use of alcohol, tobacco or illicit drugs  Current medications and supplements Functional ability and status Nutritional status Physical activity Advanced directives List of other physicians Hospitalizations, surgeries, and ER visits in previous 12 months Vitals Screenings to include cognitive, depression, and falls Referrals and appointments  In addition, I have reviewed and discussed with patient certain preventive protocols, quality metrics, and best practice recommendations. A written personalized care plan for preventive services as well as general preventive health recommendations were provided to patient.     Renee Pain, [place] 03/10/2023  I reviewed the immunizations with the patient.  She received her shingles vaccine today.  She declined a flu shot.  Breast cancer screening colon cancer screening are up-to-date.  Labs from June are within normal limits.  Patient denies any falls, depression, or memory loss.  Bone density test has been scheduled.

## 2023-03-10 NOTE — Addendum Note (Signed)
Addended by: Arta Silence on: 03/10/2023 12:17 PM   Modules accepted: Orders

## 2023-03-20 ENCOUNTER — Telehealth: Payer: Self-pay | Admitting: Family Medicine

## 2023-03-20 NOTE — Telephone Encounter (Signed)
Copied from CRM [id number]. Topic: General - Other >> Mar 20, 2023 12:57 PM Alison [name] wrote: Reason for CRM: The patient states that during her previous appointment she attempted to get paperwork printed regarding her shingles shot but the system was down. The patient can only come during lunch hours so is requesting for it to be mailed to [address] [place] [id number].

## 2023-05-23 ENCOUNTER — Ambulatory Visit
Admission: RE | Admit: 2023-05-23 | Discharge: 2023-05-23 | Disposition: A | Discharge status: Home / Self Care | Payer: Medicare Other | Source: Ambulatory Visit | Attending: Family Medicine | Admitting: Family Medicine

## 2023-05-23 DIAGNOSIS — M8588 Other specified disorders of bone density and structure, other site: Secondary | ICD-10-CM | POA: Diagnosis not present

## 2023-05-23 DIAGNOSIS — Z780 Asymptomatic menopausal state: Secondary | ICD-10-CM

## 2023-07-24 DIAGNOSIS — H31011 Macula scars of posterior pole (postinflammatory) (post-traumatic), right eye: Secondary | ICD-10-CM | POA: Diagnosis not present

## 2023-07-24 DIAGNOSIS — H35341 Macular cyst, hole, or pseudohole, right eye: Secondary | ICD-10-CM | POA: Diagnosis not present

## 2023-07-24 DIAGNOSIS — M316 Other giant cell arteritis: Secondary | ICD-10-CM | POA: Diagnosis not present

## 2023-07-24 DIAGNOSIS — Q141 Congenital malformation of retina: Secondary | ICD-10-CM | POA: Diagnosis not present

## 2023-09-11 ENCOUNTER — Other Ambulatory Visit: Payer: Self-pay

## 2023-09-11 ENCOUNTER — Telehealth: Payer: Self-pay

## 2023-09-11 MED ORDER — TRIAMTERENE-HCTZ 37.5-25 MG PO TABS: 1.0000 | ORAL_TABLET | Freq: Every day | ORAL | 2 refills | Status: AC

## 2023-09-11 NOTE — Telephone Encounter (Signed)
 Prescription Request  09/11/2023  LOV:03/10/23  What is the name of the medication or equipment? triamterene -hydrochlorothiazide  (MAXZIDE -25) 37.5-25 MG tablet [[id number]]   Have you contacted your pharmacy to request a refill? Yes   Which pharmacy would you like this sent to?  [id number]ette [facility]le, [place] - [address] [place] [address] Phone: [phone] Fax: [phone]    Patient notified that their request is being sent to the clinical staff for review and that they should receive a response within 2 business days.   Please advise at [facility] [phone]

## 2023-10-13 ENCOUNTER — Encounter: Payer: Self-pay | Admitting: Advanced Practice Midwife

## 2023-11-20 ENCOUNTER — Encounter: Payer: Self-pay | Admitting: Obstetrics and Gynecology

## 2023-11-20 ENCOUNTER — Ambulatory Visit: Admitting: Obstetrics and Gynecology

## 2023-11-20 VITALS — BP 136/83 | HR 80 | Ht 65.5 in | Wt 179.0 lb

## 2023-11-20 DIAGNOSIS — Z1331 Encounter for screening for depression: Secondary | ICD-10-CM | POA: Diagnosis not present

## 2023-11-20 DIAGNOSIS — Z01419 Encounter for gynecological examination (general) (routine) without abnormal findings: Secondary | ICD-10-CM | POA: Diagnosis not present

## 2023-11-20 NOTE — Progress Notes (Addendum)
 68 y.o. New GYN presents for AEX/ Bartholin Cyst raptured.  C/o recurrent Bartholin Cyst x 10 years.  PHQ-9=9  // GAD-7=8 Pt prefers to talk with her General Practitioner.  Last Mammogram 01/13/2022

## 2023-11-20 NOTE — Progress Notes (Signed)
 Subjective:     Alison [name] is a 68 y.o. female P1 postmenopausal and BMI 29 who is here for a comprehensive physical exam. The patient reports recent bartholin cyst abscess with associated discomfort around 7/9. Following some sitz baths and warm compresses, it drained spontaneously in early August. Patient feels significantly better. She is not sexually active. She denies pelvic pain or abnormal discharge. Patient denies urinary incontinence or issues with bowel movements. Patient with history of hysterectomy due to fibroid uterus. Patient without history of abnormal pap smear  Past Medical History:  Diagnosis Date   Allergy    Asthma    Bone spur 03/28/1985   Left Knee   Colon polyps    Fibroadenoma of breast    Right Breast   Fibroid    Headache    Heel spur    Right Foot   Hypertension    Obese    Pneumonia 03/28/1988   Past Surgical History:  Procedure Laterality Date   BUNIONECTOMY Right 12/04/2014   @[facility]    CESAREAN SECTION     MASS EXCISION Right 03/16/2015   Procedure: EXCISION OF RIGHT ANTERIOR SHOULDER SOFT TISSUE MASS;  Surgeon: Krystal Spinner, MD;  Location: [facility];  Service: General;  Laterality: Right;   VAGINAL HYSTERECTOMY     Fibroids   Family History  Problem Relation Age of Onset   Heart disease Father        cardiomyopathy (HTN)   Hypertension Father    Hypertension Mother    Hypertension Sister    Hypertension Brother     Social History   Socioeconomic History   Marital status: Divorced    Spouse name: Not on file   Number of children: 1   Years of education: Not on file   Highest education level: Not on file  Occupational History   Occupation: Clinical research associate: [employer]  Tobacco Use   Smoking status: Former    Current packs/day: 0.50    Average packs/day: 0.5 packs/day for 11.0 years (5.5 ttl pk-yrs)    Types: Cigarettes   Smokeless tobacco: Never  Vaping Use   Vaping status: Never Used   Substance and Sexual Activity   Alcohol use: No    Alcohol/week: 0.0 standard drinks of alcohol   Drug use: No   Sexual activity: Yes    Birth control/protection: None  Other Topics Concern   Not on file  Social History Narrative   Drinks one cup caffeine daily         Social Drivers of Corporate investment banker Strain: Low Risk  (03/10/2023)   Overall Financial Resource Strain (CARDIA)    Difficulty of Paying Living Expenses: Not hard at all  Recent Concern: Financial Resource Strain - Medium Risk (01/16/2023)   Overall Financial Resource Strain (CARDIA)    Difficulty of Paying Living Expenses: Somewhat hard  Food Insecurity: No Food Insecurity (03/10/2023)   Hunger Vital Sign    Worried About Running Out of Food in the Last Year: Never true    Ran Out of Food in the Last Year: Never true  Recent Concern: Food Insecurity - Food Insecurity Present (01/16/2023)   Hunger Vital Sign    Worried About Running Out of Food in the Last Year: Often true    Ran Out of Food in the Last Year: Sometimes true  Transportation Needs: No Transportation Needs (03/10/2023)   PRAPARE - Administrator, Civil Service (Medical): No  Lack of Transportation (Non-Medical): No  Recent Concern: Transportation Needs - Unmet Transportation Needs (03/10/2023)   PRAPARE - Administrator, Civil Service (Medical): Yes    Lack of Transportation (Non-Medical): No  Physical Activity: Insufficiently Active (03/10/2023)   Exercise Vital Sign    Days of Exercise per Week: 4 days    Minutes of Exercise per Session: 30 min  Stress: Stress Concern Present (03/10/2023)   [employer] of Occupational Health - Occupational Stress Questionnaire    Feeling of Stress : To some extent  Social Connections: Moderately Integrated (03/10/2023)   Social Connection and Isolation Panel    Frequency of Communication with Friends and Family: More than three times a week    Frequency of Social  Gatherings with Friends and Family: More than three times a week    Attends Religious Services: More than 4 times per year    Active Member of Clubs or Organizations: Yes    Attends Banker Meetings: More than 4 times per year    Marital Status: Divorced  Intimate Partner Violence: Not At Risk (03/10/2023)   Humiliation, Afraid, Rape, and Kick questionnaire    Fear of Current or Ex-Partner: No    Emotionally Abused: No    Physically Abused: No    Sexually Abused: No   Health Maintenance  Topic Date Due   Pneumococcal Vaccine: 50+ Years (2 of 2 - PCV) 04/23/2021   COVID-19 Vaccine (8 - 2024-25 season) 11/27/2022   INFLUENZA VACCINE  10/27/2023   MAMMOGRAM  01/14/2024   Medicare Annual Wellness (AWV)  03/09/2024   Colonoscopy  08/21/2024   DEXA SCAN  Completed   Hepatitis C Screening  Completed   Zoster Vaccines- Shingrix   Completed   HPV VACCINES  Aged Out   Meningococcal B Vaccine  Aged Out   DTaP/Tdap/Td  Discontinued       Review of Systems Pertinent items noted in HPI and remainder of comprehensive ROS otherwise negative.   Objective:  Blood pressure 136/83, pulse 80, height 5' 5.5 (1.664 m), weight 179 lb (81.2 kg).   GENERAL: Well-developed, well-nourished female in no acute distress.  HEENT: Normocephalic, atraumatic. Sclerae anicteric.  NECK: Supple. Normal thyroid .  LUNGS: Clear to auscultation bilaterally.  HEART: Regular rate and rhythm. BREASTS: Symmetric in size. No palpable masses or lymphadenopathy, skin changes, or nipple drainage. ABDOMEN: Soft, nontender, nondistended. No organomegaly. PELVIC: Normal external female genitalia. Vagina is pink and rugated.  Normal discharge. Vaginal vault intact. No adnexal mass or tenderness. Chaperone present during the pelvic exam EXTREMITIES: No cyanosis, clubbing, or edema, 2+ distal pulses.     Assessment:    Healthy female exam.      Plan:    Pap smear no longer indicated Patient is due for  mammogram this year and colonoscopy next year. Patient prefers to follow up with PCP for scheduling Reassurance provided regarding healed bartholin abscess RTC prn See After Visit Summary for Counseling Recommendations

## 2024-01-11 ENCOUNTER — Ambulatory Visit: Admitting: Family Medicine

## 2024-01-11 ENCOUNTER — Encounter: Payer: Self-pay | Admitting: Family Medicine

## 2024-01-11 ENCOUNTER — Telehealth: Payer: Self-pay | Admitting: Family Medicine

## 2024-01-11 VITALS — BP 150/100 | HR 93 | Temp 98.6°F | Ht 65.5 in | Wt 174.5 lb

## 2024-01-11 DIAGNOSIS — Z1231 Encounter for screening mammogram for malignant neoplasm of breast: Secondary | ICD-10-CM | POA: Diagnosis not present

## 2024-01-11 DIAGNOSIS — Z23 Encounter for immunization: Secondary | ICD-10-CM | POA: Diagnosis not present

## 2024-01-11 DIAGNOSIS — L680 Hirsutism: Secondary | ICD-10-CM | POA: Diagnosis not present

## 2024-01-11 DIAGNOSIS — L659 Nonscarring hair loss, unspecified: Secondary | ICD-10-CM | POA: Diagnosis not present

## 2024-01-11 MED ORDER — ZOLPIDEM TARTRATE 5 MG PO TABS: 5.0000 mg | ORAL_TABLET | Freq: Every evening | ORAL | 1 refills | Status: AC | PRN

## 2024-01-11 NOTE — Telephone Encounter (Signed)
 Copied from CRM [id number]. Topic: Compliment - Staff >> Jan 11, 2024 12:57 PM Joesph NOVAK wrote: Date of encounter: 01/11/24 Details of compliment: Patient would like to thank Delon for being so helpful today.  Who would the patient like to thank/see rewarded? Delon On a scale of 1-10, how was your experience? 10  Route to Research officer, political party.

## 2024-01-11 NOTE — Progress Notes (Signed)
 Subjective:    Patient ID: Alison [name], female    DOB: 10-26-1955, 68 y.o.   MRN: [id number] Patient is here today requesting a flu shot as well as her mammogram.  She is also requesting a refill on her Ambien  that she takes to help her sleep.  However the main reason she made the appointment is due to thinning of her hair.  She reports gradual thinning of her hair in the center of her scalp and on both temples.  There is no discrete patches of alopecia.  Instead she appears to be losing hair and an androgenic pattern.  She denies any fevers or chills or night sweats or weight loss.  Past Medical History:  Diagnosis Date   Allergy 03/28/1976   I allergy tested as a child   Asthma    Bone spur 03/28/1985   Left Knee   Colon polyps    Fibroadenoma of breast    Right Breast   Fibroid    Headache    Heel spur    Right Foot   Hypertension 03/28/1989   Obese    Pneumonia 03/28/1988   Past Surgical History:  Procedure Laterality Date   BUNIONECTOMY Right 12/04/2014   @[facility]    CESAREAN SECTION     MASS EXCISION Right 03/16/2015   Procedure: EXCISION OF RIGHT ANTERIOR SHOULDER SOFT TISSUE MASS;  Surgeon: Krystal Spinner, MD;  Location: [facility];  Service: General;  Laterality: Right;   VAGINAL HYSTERECTOMY     Fibroids   Current Outpatient Medications on File Prior to Visit  Medication Sig Dispense Refill   albuterol  (PROAIR  HFA) 108 (90 Base) MCG/ACT inhaler Inhale 2 puffs into the lungs every 4 (four) hours as needed for wheezing or shortness of breath. 1 each 1   Ascorbic Acid (VITAMIN C) 100 MG tablet Take 500 mg by mouth daily.     azithromycin  (ZITHROMAX ) 250 MG tablet 2 tabs poqday1, 1 tab poqday 2-5 6 tablet 0   Biotin 1000 MCG CHEW Chew by mouth.     fluticasone  (FLONASE ) 50 MCG/ACT nasal spray Place 2 sprays into both nostrils daily. 16 g 6   tiZANidine  (ZANAFLEX ) 4 MG capsule Take 1 capsule (4 mg total) by mouth 3 (three) times daily as needed for muscle  spasms. 90 capsule 1   triamterene -hydrochlorothiazide  (MAXZIDE -25) 37.5-25 MG tablet Take 1 tablet by mouth daily. 90 tablet 2   No current facility-administered medications on file prior to visit.   Allergies  Allergen Reactions   Penicillins Other (See Comments)    REACTION: as child   Social History   Socioeconomic History   Marital status: Divorced    Spouse name: Not on file   Number of children: 1   Years of education: Not on file   Highest education level: Bachelor's degree (e.g., BA, AB, BS)  Occupational History   Occupation: Clinical research associate: [employer]  Tobacco Use   Smoking status: Former    Current packs/day: 0.50    Average packs/day: 0.5 packs/day for 11.0 years (5.5 ttl pk-yrs)    Types: Cigarettes   Smokeless tobacco: Never  Vaping Use   Vaping status: Never Used  Substance and Sexual Activity   Alcohol use: No    Alcohol/week: 0.0 standard drinks of alcohol   Drug use: No   Sexual activity: Yes    Birth control/protection: None  Other Topics Concern   Not on file  Social History Narrative   Drinks  one cup caffeine daily         Social Drivers of Health   Financial Resource Strain: Medium Risk (01/08/2024)   Overall Financial Resource Strain (CARDIA)    Difficulty of Paying Living Expenses: Somewhat hard  Food Insecurity: No Food Insecurity (01/08/2024)   Hunger Vital Sign    Worried About Running Out of Food in the Last Year: Never true    Ran Out of Food in the Last Year: Never true  Transportation Needs: No Transportation Needs (01/08/2024)   PRAPARE - Administrator, Civil Service (Medical): No    Lack of Transportation (Non-Medical): No  Physical Activity: Insufficiently Active (01/08/2024)   Exercise Vital Sign    Days of Exercise per Week: 1 day    Minutes of Exercise per Session: 10 min  Stress: Stress Concern Present (01/08/2024)   [employer] of Occupational Health - Occupational  Stress Questionnaire    Feeling of Stress: To some extent  Social Connections: Moderately Integrated (01/08/2024)   Social Connection and Isolation Panel    Frequency of Communication with Friends and Family: More than three times a week    Frequency of Social Gatherings with Friends and Family: Twice a week    Attends Religious Services: More than 4 times per year    Active Member of [employer] or Organizations: Yes    Attends Banker Meetings: More than 4 times per year    Marital Status: Separated  Intimate Partner Violence: Not At Risk (03/10/2023)   Humiliation, Afraid, Rape, and Kick questionnaire    Fear of Current or Ex-Partner: No    Emotionally Abused: No    Physically Abused: No    Sexually Abused: No   Family History  Problem Relation Age of Onset   Heart disease Father        cardiomyopathy (HTN)   Hypertension Father    Hypertension Mother    Hypertension Sister    Hypertension Brother      Review of Systems  All other systems reviewed and are negative.      Objective:   Physical Exam Vitals reviewed.  Constitutional:      General: She is not in acute distress.    Appearance: She is well-developed. She is not diaphoretic.  HENT:     Head: Normocephalic and atraumatic.     Right Ear: Tympanic membrane, ear canal and external ear normal.     Left Ear: Tympanic membrane, ear canal and external ear normal.     Mouth/Throat:     Pharynx: No oropharyngeal exudate.  Eyes:     General: No scleral icterus.       Right eye: No discharge.        Left eye: No discharge.     Conjunctiva/sclera: Conjunctivae normal.     Pupils: Pupils are equal, round, and reactive to light.  Neck:     Thyroid : No thyromegaly.     Vascular: No JVD.     Trachea: No tracheal deviation.  Cardiovascular:     Rate and Rhythm: Normal rate and regular rhythm.     Heart sounds: Normal heart sounds. No murmur heard.    No friction rub. No gallop.  Pulmonary:     Effort:  Pulmonary effort is normal. No respiratory distress.     Breath sounds: Normal breath sounds. No stridor. No wheezing or rales.  Chest:     Chest wall: No tenderness.  Musculoskeletal:     Cervical back: Neck  supple.  Lymphadenopathy:     Cervical: No cervical adenopathy.  Neurological:     Mental Status: She is alert.     Motor: No abnormal muscle tone.           Assessment & Plan:  Immunization due - Plan: Flu vaccine HIGH DOSE PF(Fluzone Trivalent), CANCELED: Pneumococcal conjugate vaccine 20-valent (Prevnar 20)  Alopecia - Plan: Flu vaccine HIGH DOSE PF(Fluzone Trivalent), CBC with Differential/Platelet, Comprehensive metabolic panel with GFR, TSH, CANCELED: Pneumococcal conjugate vaccine 20-valent (Prevnar 20)  Hirsutism - Plan: Testosterone, DHEA  Encounter for screening mammogram for malignant neoplasm of breast - Plan: MM 3D SCREENING MAMMOGRAM BILATERAL BREAST Patient received flu shot today.  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  I will check a CBC a CMP and a TSH.  She also reports hirsutism on her upper lip, her neck and on her chest.  For that reason I will check testosterone levels and DHEA levels.  If labs are normal, I would recommend Rogaine for women.  Reassess in 3 months.  If not working she can try Propecia

## 2024-01-12 ENCOUNTER — Ambulatory Visit: Payer: Self-pay | Admitting: Family Medicine

## 2024-01-24 LAB — CBC WITH DIFFERENTIAL/PLATELET
Absolute Lymphocytes: 2970 {cells}/uL (ref 850–3900)
Absolute Monocytes: 640 {cells}/uL (ref 200–950)
Basophils Absolute: 40 {cells}/uL (ref 0–200)
Basophils Relative: 0.5 %
Eosinophils Absolute: 261 {cells}/uL (ref 15–500)
Eosinophils Relative: 3.3 %
HCT: 38.9 % (ref 35.0–45.0)
Hemoglobin: 12.9 g/dL (ref 11.7–15.5)
MCH: 28.9 pg (ref 27.0–33.0)
MCHC: 33.2 g/dL (ref 32.0–36.0)
MCV: 87.2 fL (ref 80.0–100.0)
MPV: 9.9 fL (ref 7.5–12.5)
Monocytes Relative: 8.1 %
Neutro Abs: 3990 {cells}/uL (ref 1500–7800)
Neutrophils Relative %: 50.5 %
Platelets: 332 Thousand/uL (ref 140–400)
RBC: 4.46 Million/uL (ref 3.80–5.10)
RDW: 13 % (ref 11.0–15.0)
Total Lymphocyte: 37.6 %
WBC: 7.9 Thousand/uL (ref 3.8–10.8)

## 2024-01-24 LAB — COMPREHENSIVE METABOLIC PANEL WITH GFR
AG Ratio: 1.5 (calc) (ref 1.0–2.5)
ALT: 16 U/L (ref 6–29)
AST: 16 U/L (ref 10–35)
Albumin: 4.3 g/dL (ref 3.6–5.1)
Alkaline phosphatase (APISO): 59 U/L (ref 37–153)
BUN: 16 mg/dL (ref 7–25)
CO2: 30 mmol/L (ref 20–32)
Calcium: 9.7 mg/dL (ref 8.6–10.4)
Chloride: 103 mmol/L (ref 98–110)
Creat: 0.73 mg/dL (ref 0.50–1.05)
Globulin: 2.9 g/dL (ref 1.9–3.7)
Glucose, Bld: 100 mg/dL — ABNORMAL HIGH (ref 65–99)
Potassium: 4.3 mmol/L (ref 3.5–5.3)
Sodium: 141 mmol/L (ref 135–146)
Total Bilirubin: 0.4 mg/dL (ref 0.2–1.2)
Total Protein: 7.2 g/dL (ref 6.1–8.1)
eGFR: 91 mL/min/1.73m2 (ref >=60)

## 2024-01-24 LAB — DHEA

## 2024-01-24 LAB — TSH: TSH: 0.4 m[IU]/L (ref 0.40–4.50)

## 2024-01-24 LAB — TESTOSTERONE: Testosterone: 13 ng/dL

## 2024-01-26 ENCOUNTER — Ambulatory Visit

## 2024-02-06 ENCOUNTER — Encounter: Admitting: Family Medicine

## 2024-02-07 ENCOUNTER — Ambulatory Visit

## 2024-02-14 ENCOUNTER — Ambulatory Visit
Admission: RE | Admit: 2024-02-14 | Discharge: 2024-02-14 | Disposition: A | Discharge status: Home / Self Care | Source: Ambulatory Visit | Attending: Family Medicine | Admitting: Family Medicine

## 2024-02-14 DIAGNOSIS — Z1231 Encounter for screening mammogram for malignant neoplasm of breast: Secondary | ICD-10-CM

## 2024-03-04 ENCOUNTER — Ambulatory Visit: Admitting: Family Medicine

## 2024-03-04 ENCOUNTER — Encounter: Payer: Self-pay | Admitting: Family Medicine

## 2024-03-04 VITALS — BP 130/82 | HR 93 | Temp 98.8°F | Ht 65.5 in | Wt 177.2 lb

## 2024-03-04 DIAGNOSIS — L659 Nonscarring hair loss, unspecified: Secondary | ICD-10-CM

## 2024-03-04 DIAGNOSIS — I10 Essential (primary) hypertension: Secondary | ICD-10-CM

## 2024-03-04 DIAGNOSIS — Z0000 Encounter for general adult medical examination without abnormal findings: Secondary | ICD-10-CM

## 2024-03-04 MED ORDER — FINASTERIDE 1 MG PO TABS: 1.0000 mg | ORAL_TABLET | Freq: Every day | ORAL | 5 refills | Status: AC

## 2024-03-04 NOTE — Progress Notes (Signed)
 Subjective:    Patient ID: Alison [name], female    DOB: 05-12-1955, 68 y.o.   MRN: [id number]  Patient is a very pleasant 68 year old African-American female here today for complete physical exam.  Patient had a mammogram in November that was normal.  She had a bone density test in February 2025 that showed a T-score of -1.3.  She had a colonoscopy in 2016.  Although they removed several polyps they were all hyperplastic polyps.  Repeat colonoscopy was recommended again in 2026.  Patient has a history of a hysterectomy and therefore does not require Pap smear.  She had a CBC, CMP, TSH, and a testosterone  level checked in October due to alopecia.  These labs were unremarkable.  She is overdue to check her cholesterol. Wt Readings from Last 3 Encounters:  03/04/24 177 lb 4 oz (80.4 kg)  01/11/24 174 lb 8 oz (79.2 kg)  11/20/23 179 lb (81.2 kg)   Patient had diffuse thinning of the hair on both temples.  This is significant.  There are no discrete well-demarcated patches of alopecia.  Instead of the generalized thinning of the hair however the scalp is visible in multiple areas.  There is no underlying rash or inflammation.  She would like to see dermatology for this.  Past Medical History:  Diagnosis Date   Allergy 03/28/1976   I allergy tested as a child   Asthma    Bone spur 03/28/1985   Left Knee   Colon polyps    Fibroadenoma of breast    Right Breast   Fibroid    Headache    Heel spur    Right Foot   Hypertension 03/28/1989   Obese    Pneumonia 03/28/1988   Past Surgical History:  Procedure Laterality Date   BUNIONECTOMY Right 12/04/2014   @[facility]    CESAREAN SECTION     MASS EXCISION Right 03/16/2015   Procedure: EXCISION OF RIGHT ANTERIOR SHOULDER SOFT TISSUE MASS;  Surgeon: Krystal Spinner, MD;  Location: [facility];  Service: General;  Laterality: Right;   VAGINAL HYSTERECTOMY     Fibroids   Current Outpatient Medications on File Prior to Visit  Medication  Sig Dispense Refill   albuterol  (PROAIR  HFA) 108 (90 Base) MCG/ACT inhaler Inhale 2 puffs into the lungs every 4 (four) hours as needed for wheezing or shortness of breath. 1 each 1   Ascorbic Acid (VITAMIN C) 100 MG tablet Take 500 mg by mouth daily.     azithromycin  (ZITHROMAX ) 250 MG tablet 2 tabs poqday1, 1 tab poqday 2-5 6 tablet 0   Biotin 1000 MCG CHEW Chew by mouth.     fluticasone  (FLONASE ) 50 MCG/ACT nasal spray Place 2 sprays into both nostrils daily. 16 g 6   tiZANidine  (ZANAFLEX ) 4 MG capsule Take 1 capsule (4 mg total) by mouth 3 (three) times daily as needed for muscle spasms. 90 capsule 1   triamterene -hydrochlorothiazide  (MAXZIDE -25) 37.5-25 MG tablet Take 1 tablet by mouth daily. 90 tablet 2   zolpidem  (AMBIEN ) 5 MG tablet Take 1 tablet (5 mg total) by mouth at bedtime as needed. For insomnia 30 tablet 1   No current facility-administered medications on file prior to visit.   Allergies  Allergen Reactions   Penicillins Other (See Comments)    REACTION: as child   Social History   Socioeconomic History   Marital status: Divorced    Spouse name: Not on file   Number of children: 1   Years of education:  Not on file   Highest education level: Bachelor's degree (e.g., BA, AB, BS)  Occupational History   Occupation: Clinical Research Associate: [employer]  Tobacco Use   Smoking status: Former    Current packs/day: 0.50    Average packs/day: 0.5 packs/day for 11.0 years (5.5 ttl pk-yrs)    Types: Cigarettes   Smokeless tobacco: Never  Vaping Use   Vaping status: Never Used  Substance and Sexual Activity   Alcohol use: No    Alcohol/week: 0.0 standard drinks of alcohol   Drug use: No   Sexual activity: Yes    Birth control/protection: None  Other Topics Concern   Not on file  Social History Narrative   Drinks one cup caffeine daily         Social Drivers of Health   Financial Resource Strain: Medium Risk (01/08/2024)   Overall Financial  Resource Strain (CARDIA)    Difficulty of Paying Living Expenses: Somewhat hard  Food Insecurity: No Food Insecurity (01/08/2024)   Hunger Vital Sign    Worried About Running Out of Food in the Last Year: Never true    Ran Out of Food in the Last Year: Never true  Transportation Needs: No Transportation Needs (01/08/2024)   PRAPARE - Administrator, Civil Service (Medical): No    Lack of Transportation (Non-Medical): No  Physical Activity: Insufficiently Active (01/08/2024)   Exercise Vital Sign    Days of Exercise per Week: 1 day    Minutes of Exercise per Session: 10 min  Stress: Stress Concern Present (01/08/2024)   [employer] of Occupational Health - Occupational Stress Questionnaire    Feeling of Stress: To some extent  Social Connections: Moderately Integrated (01/08/2024)   Social Connection and Isolation Panel    Frequency of Communication with Friends and Family: More than three times a week    Frequency of Social Gatherings with Friends and Family: Twice a week    Attends Religious Services: More than 4 times per year    Active Member of [employer] or Organizations: Yes    Attends Banker Meetings: More than 4 times per year    Marital Status: Separated  Intimate Partner Violence: Not At Risk (03/10/2023)   Humiliation, Afraid, Rape, and Kick questionnaire    Fear of Current or Ex-Partner: No    Emotionally Abused: No    Physically Abused: No    Sexually Abused: No   Family History  Problem Relation Age of Onset   Heart disease Father        cardiomyopathy (HTN)   Hypertension Father    Hypertension Mother    Hypertension Sister    Hypertension Brother      Review of Systems  All other systems reviewed and are negative.      Objective:   Physical Exam Vitals reviewed.  Constitutional:      General: She is not in acute distress.    Appearance: She is well-developed. She is not diaphoretic.  HENT:     Head: Normocephalic and  atraumatic.     Right Ear: External ear normal.     Left Ear: External ear normal.     Nose: Nose normal.     Mouth/Throat:     Pharynx: No oropharyngeal exudate.  Eyes:     General: No scleral icterus.       Right eye: No discharge.        Left eye: No discharge.  Conjunctiva/sclera: Conjunctivae normal.     Pupils: Pupils are equal, round, and reactive to light.  Neck:     Thyroid : No thyromegaly.     Vascular: No JVD.     Trachea: No tracheal deviation.  Cardiovascular:     Rate and Rhythm: Normal rate and regular rhythm.     Heart sounds: Normal heart sounds. No murmur heard.    No friction rub. No gallop.  Pulmonary:     Effort: Pulmonary effort is normal. No respiratory distress.     Breath sounds: Normal breath sounds. No stridor. No wheezing or rales.  Chest:     Chest wall: No tenderness.  Abdominal:     General: Bowel sounds are normal. There is no distension.     Palpations: Abdomen is soft. There is no mass.     Tenderness: There is no abdominal tenderness. There is no guarding or rebound.  Musculoskeletal:        General: No tenderness. Normal range of motion.     Cervical back: Normal range of motion and neck supple.  Lymphadenopathy:     Cervical: No cervical adenopathy.  Skin:    Coloration: Skin is not pale.     Findings: No erythema or rash.  Neurological:     Mental Status: She is alert and oriented to person, place, and time.     Cranial Nerves: No cranial nerve deficit.     Motor: No abnormal muscle tone.     Coordination: Coordination normal.     Deep Tendon Reflexes: Reflexes normal.  Psychiatric:        Behavior: Behavior normal.        Thought Content: Thought content normal.        Judgment: Judgment normal.           Assessment & Plan:  General medical exam - Plan: Lipid panel  Benign essential HTN - Plan: Lipid panel Patient's physical exam today is normal.  I will check a fasting lipid panel to monitor her cholesterol.   Regarding her immunizations, she had a flu shot in October.  This is up-to-date.  Her pneumonia vaccine is up-to-date.  I recommended that she receive the second shingles vaccine.  We discussed the COVID-vaccine.  The remainder of her immunizations are up-to-date.  Mammogram is up-to-date.  Bone density is up-to-date.  She is due for a colonoscopy next year.  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  I recommended trying Propecia  1 mg daily and reassessing in 3 to 4 months.  However I would be happy to schedule the patient to see a dermatologist for a second opinion.  I will check a lipid panel but I will also check an iron level and an ANA as well.  Her CBC CMP and TSH were normal in October.

## 2024-03-05 ENCOUNTER — Ambulatory Visit: Payer: Self-pay | Admitting: Family Medicine

## 2024-03-06 DIAGNOSIS — L659 Nonscarring hair loss, unspecified: Secondary | ICD-10-CM | POA: Insufficient documentation

## 2024-03-06 LAB — LIPID PANEL
Cholesterol: 220 mg/dL — ABNORMAL HIGH (ref <200)
HDL: 48 mg/dL — ABNORMAL LOW (ref >=50)
LDL Cholesterol (Calc): 142 mg/dL — ABNORMAL HIGH
Non-HDL Cholesterol (Calc): 172 mg/dL — ABNORMAL HIGH (ref <130)
Total CHOL/HDL Ratio: 4.6 (calc) (ref <5.0)
Triglycerides: 162 mg/dL — ABNORMAL HIGH (ref <150)

## 2024-03-06 LAB — ANA: Anti Nuclear Antibody (ANA): POSITIVE — AB

## 2024-03-06 LAB — IRON: Iron: 96 ug/dL (ref 45–160)

## 2024-03-06 LAB — ANTI-NUCLEAR AB-TITER (ANA TITER)
ANA TITER: 1:40 {titer} — ABNORMAL HIGH
ANA Titer 1: 1:1280 {titer} — ABNORMAL HIGH

## 2024-03-11 ENCOUNTER — Telehealth: Payer: Self-pay | Admitting: Family Medicine

## 2024-03-11 ENCOUNTER — Telehealth: Payer: Self-pay

## 2024-03-11 NOTE — Telephone Encounter (Signed)
 Copied from CRM #[id number]. Topic: Referral - Status >> Mar 11, 2024  1:14 PM Alison [name] wrote: Reason for CRM: pt called and requested for a nurse to call her to give an update on er dermatology referral where it was sent too. Please call and advise.

## 2024-03-11 NOTE — Telephone Encounter (Signed)
 Hi Linda, Can you check on this for me? It hasn't quite been a week yet since the referral order was placed. I'll let the patient know it takes at least 5 days. Thank you!  Ronal Bradley  Copied from CRM #[id number]. Topic: Referral - Status >> Mar 11, 2024  1:14 PM Taleah C wrote: Reason for CRM: pt called and requested for a nurse to call her to give an update on er dermatology referral where it was sent too. Please call and advise

## 2024-03-13 ENCOUNTER — Ambulatory Visit

## 2024-03-13 VITALS — Ht 65.5 in | Wt 177.0 lb

## 2024-03-13 DIAGNOSIS — Z0000 Encounter for general adult medical examination without abnormal findings: Secondary | ICD-10-CM

## 2024-03-13 NOTE — Progress Notes (Signed)
 Chief Complaint  Patient presents with   Medicare Wellness     Subjective:   Alison [name] is a 68 y.o. female who presents for a Medicare Annual Wellness Visit.  Visit info / Clinical Intake: Medicare Wellness Visit Type:: Initial Annual Wellness Visit Persons participating in visit and providing information:: patient Medicare Wellness Visit Mode:: Telephone If telephone:: video declined Since this visit was completed virtually, some vitals may be partially provided or unavailable. Missing vitals are due to the limitations of the virtual format.: Documented vitals are patient reported If Telephone or Video please confirm:: I connected with patient using audio/video enable telemedicine. I verified patient identity with two identifiers, discussed telehealth limitations, and patient agreed to proceed. Patient Location:: home Provider Location:: office Interpreter Needed?: No Pre-visit prep was completed: yes AWV questionnaire completed by patient prior to visit?: no Living arrangements:: (!) lives alone Patient's Overall Health Status Rating: very good Typical amount of pain: some Does pain affect daily life?: no Are you currently prescribed opioids?: no  Dietary Habits and Nutritional Risks How many meals a day?: 3 Eats fruit and vegetables daily?: yes Most meals are obtained by: preparing own meals In the last 2 weeks, have you had any of the following?: none Diabetic:: no  Functional Status Activities of Daily Living (to include ambulation/medication): Independent Ambulation: Independent Medication Administration: Independent Home Management (perform basic housework or laundry): Independent Manage your own finances?: yes Primary transportation is: driving Concerns about vision?: no *vision screening is required for WTM* Concerns about hearing?: no  Fall Screening Falls in the past year?: 0 Number of falls in past year: 0 Was there an injury with Fall?: 0 Fall  Risk Category Calculator: 0 Patient Fall Risk Level: Low Fall Risk  Fall Risk Patient at Risk for Falls Due to: No Fall Risks Fall risk Follow up: Falls evaluation completed; Education provided; Falls prevention discussed  Home and Transportation Safety: All rugs have non-skid backing?: yes All stairs or steps have railings?: yes Grab bars in the bathtub or shower?: yes Have non-skid surface in bathtub or shower?: yes Good home lighting?: yes Regular seat belt use?: yes Hospital stays in the last year:: no  Cognitive Assessment Difficulty concentrating, remembering, or making decisions? : no Will 6CIT or Mini Cog be Completed: no 6CIT or Mini Cog Declined: patient alert, oriented, able to answer questions appropriately and recall recent events  Advance Directives (For Healthcare) Does Patient Have a Medical Advance Directive?: No Would patient like information on creating a medical advance directive?: Yes (MAU/Ambulatory/Procedural Areas - Information given)  Reviewed/Updated  Reviewed/Updated: Reviewed All (Medical, Surgical, Family, Medications, Allergies, Care Teams, Patient Goals)    Allergies (verified) Penicillins   Current Medications (verified) Outpatient Encounter Medications as of 03/13/2024  Medication Sig   albuterol  (PROAIR  HFA) 108 (90 Base) MCG/ACT inhaler Inhale 2 puffs into the lungs every 4 (four) hours as needed for wheezing or shortness of breath.   Ascorbic Acid (VITAMIN C) 100 MG tablet Take 500 mg by mouth daily.   finasteride  (PROPECIA ) 1 MG tablet Take 1 tablet (1 mg total) by mouth daily.   fluticasone  (FLONASE ) 50 MCG/ACT nasal spray Place 2 sprays into both nostrils daily.   tiZANidine  (ZANAFLEX ) 4 MG capsule Take 1 capsule (4 mg total) by mouth 3 (three) times daily as needed for muscle spasms.   triamterene -hydrochlorothiazide  (MAXZIDE -25) 37.5-25 MG tablet Take 1 tablet by mouth daily.   zolpidem  (AMBIEN ) 5 MG tablet Take 1 tablet (5 mg total)  by  mouth at bedtime as needed. For insomnia   No facility-administered encounter medications on file as of 03/13/2024.    History: Past Medical History:  Diagnosis Date   Allergy 03/28/1976   I allergy tested as a child   Asthma    Bone spur 03/28/1985   Left Knee   Colon polyps    Fibroadenoma of breast    Right Breast   Fibroid    Headache    Heel spur    Right Foot   Hypertension 03/28/1989   Obese    Pneumonia 03/28/1988   Past Surgical History:  Procedure Laterality Date   BUNIONECTOMY Right 12/04/2014   @[facility]    CESAREAN SECTION     MASS EXCISION Right 03/16/2015   Procedure: EXCISION OF RIGHT ANTERIOR SHOULDER SOFT TISSUE MASS;  Surgeon: Krystal Spinner, MD;  Location: [facility];  Service: General;  Laterality: Right;   VAGINAL HYSTERECTOMY     Fibroids   Family History  Problem Relation Age of Onset   Heart disease Father        cardiomyopathy (HTN)   Hypertension Father    Hypertension Mother    Hypertension Sister    Hypertension Brother    Social History   Occupational History   Occupation: Clinical Research Associate: [employer]  Tobacco Use   Smoking status: Former    Current packs/day: 0.00    Average packs/day: 0.5 packs/day for 11.0 years (5.5 ttl pk-yrs)    Types: Cigarettes    Quit date: 03/28/2017    Years since quitting: 6.9   Smokeless tobacco: Never   Tobacco comments:    Thank God for help quiting  Vaping Use   Vaping status: Never Used  Substance and Sexual Activity   Alcohol use: Not Currently    Alcohol/week: 1.0 standard drink of alcohol    Types: 1 Glasses of wine per week   Drug use: Never   Sexual activity: Not Currently    Birth control/protection: None   Tobacco Counseling Counseling given: Not Answered Tobacco comments: Thank God for help quiting  SDOH Screenings   Food Insecurity: No Food Insecurity (03/13/2024)  Housing: Low Risk (03/13/2024)  Transportation Needs: No Transportation  Needs (03/13/2024)  Utilities: Not At Risk (03/13/2024)  Alcohol Screen: Low Risk (03/10/2023)  Depression (PHQ2-9): Low Risk (03/13/2024)  Financial Resource Strain: Medium Risk (01/08/2024)  Physical Activity: Insufficiently Active (03/13/2024)  Social Connections: Moderately Integrated (03/13/2024)  Stress: No Stress Concern Present (03/13/2024)  Recent Concern: Stress - Stress Concern Present (01/08/2024)  Tobacco Use: Medium Risk (03/13/2024)  Health Literacy: Adequate Health Literacy (03/13/2024)   See flowsheets for full screening details  Depression Screen PHQ 2 & 9 Depression Scale- Over the past 2 weeks, how often have you been bothered by any of the following problems? Little interest or pleasure in doing things: 0 Feeling down, depressed, or hopeless (PHQ Adolescent also includes...irritable): 0 PHQ-2 Total Score: 0 Trouble falling or staying asleep, or sleeping too much: 3 Feeling tired or having little energy: 3 Poor appetite or overeating (PHQ Adolescent also includes...weight loss): 3 Feeling bad about yourself - or that you are a failure or have let yourself or your family down: 0 Trouble concentrating on things, such as reading the newspaper or watching television (PHQ Adolescent also includes...like school work): 0 Moving or speaking so slowly that other people could have noticed. Or the opposite - being so fidgety or restless that you have been moving around a lot  more than usual: 0 Thoughts that you would be better off dead, or of hurting yourself in some way: 0 PHQ-9 Total Score: 9 If you checked off any problems, how difficult have these problems made it for you to do your work, take care of things at home, or get along with other people?: Not difficult at all  Depression Treatment Depression Interventions/Treatment : Patient refuses Treatment     Goals Addressed             This Visit's Progress    Remain active and independent   On track             Objective:    Today's Vitals   03/13/24 1458  Weight: 177 lb (80.3 kg)  Height: 5' 5.5 (1.664 m)   Body mass index is 29.01 kg/m.  Hearing/Vision screen Hearing Screening - Comments:: Patient is able to hear conversational tones without difficulty. No issues reported.   Vision Screening - Comments:: Wears rx glasses - up to date with routine eye exams with Dr. Fleeta  Immunizations and Health Maintenance Health Maintenance  Topic Date Due   Pneumococcal Vaccine: 50+ Years (2 of 2 - PCV) 04/23/2021   COVID-19 Vaccine (8 - 2025-26 season) 03/20/2024 (Originally 11/27/2023)   Colonoscopy  08/21/2024   Medicare Annual Wellness (AWV)  03/13/2025   Mammogram  02/13/2026   Influenza Vaccine  Completed   Bone Density Scan  Completed   Hepatitis C Screening  Completed   Zoster Vaccines- Shingrix   Completed   Meningococcal B Vaccine  Aged Out   DTaP/Tdap/Td  Discontinued        Assessment/Plan:  This is a routine wellness examination for Alison [name].  Patient Care Team: Duanne Butler DASEN, MD as PCP - General (Family Medicine) Fleeta Zerita DASEN, MD as Consulting Physician (Ophthalmology)  I have personally reviewed and noted the following in the patients chart:   Medical and social history Use of alcohol, tobacco or illicit drugs  Current medications and supplements including opioid prescriptions. Functional ability and status Nutritional status Physical activity Advanced directives List of other physicians Hospitalizations, surgeries, and ER visits in previous 12 months Vitals Screenings to include cognitive, depression, and falls Referrals and appointments  No orders of the defined types were placed in this encounter.  In addition, I have reviewed and discussed with patient certain preventive protocols, quality metrics, and best practice recommendations. A written personalized care plan for preventive services as well as general preventive health recommendations were provided to  patient.   Lavelle Charmaine Browner, LPN   [id number]   Return in 1 year (on 03/13/2025).  After Visit Summary: (Mail) Due to this being a telephonic visit, the after visit summary with patients personalized plan was offered to patient via mail   Nurse Notes: Separate telephone note sent for (patient concern with leg pain)

## 2024-03-13 NOTE — Patient Instructions (Addendum)
 Alison [name],  Thank you for taking the time for your Medicare Wellness Visit. I appreciate your continued commitment to your health goals. Please review the care plan we discussed, and feel free to reach out if I can assist you further.  Please note that Annual Wellness Visits do not include a physical exam. Some assessments may be limited, especially if the visit was conducted virtually. If needed, we may recommend an in-person follow-up with your provider.  Ongoing Care Seeing your primary care provider every 3 to 6 months helps us  monitor your health and provide consistent, personalized care.   Referrals If a referral was made during today's visit and you haven't received any updates within two weeks, please contact the referred provider directly to check on the status.  Your Dermatology appointment is scheduled for Thurs 04/25/2024 at 11:20am, arrival time of 11:05am with the following provider:     [facility] [address] [place], [place]   Ph: [phone]  Recommended Screenings:  Health Maintenance  Topic Date Due   Pneumococcal Vaccine for age over 48 (2 of 2 - PCV) 04/23/2021   COVID-19 Vaccine (8 - 2025-26 season) 03/20/2024*   Colon Cancer Screening  08/21/2024   Medicare Annual Wellness Visit  03/13/2025   Breast Cancer Screening  02/13/2026   Flu Shot  Completed   Osteoporosis screening with Bone Density Scan  Completed   Hepatitis C Screening  Completed   Zoster (Shingles) Vaccine  Completed   Meningitis B Vaccine  Aged Out   DTaP/Tdap/Td vaccine  Discontinued  *Topic was postponed. The date shown is not the original due date.       03/13/2024    2:59 PM  Advanced Directives  Does Patient Have a Medical Advance Directive? No  Would patient like information on creating a medical advance directive? Yes (MAU/Ambulatory/Procedural Areas - Information given)   Information on Advanced Care Planning can be found at  [place]  Secretary of [facility] Advance Health Care Directives Advance Health Care Directives ([email or web address]    Vision: Annual vision screenings are recommended for early detection of glaucoma, cataracts, and diabetic retinopathy. These exams can also reveal signs of chronic conditions such as diabetes and high blood pressure.  Dental: Annual dental screenings help detect early signs of oral cancer, gum disease, and other conditions linked to overall health, including heart disease and diabetes.  Please see the attached documents for additional preventive care recommendations.

## 2024-04-04 ENCOUNTER — Encounter: Admitting: Family Medicine

## 2024-04-05 ENCOUNTER — Other Ambulatory Visit

## 2024-04-05 DIAGNOSIS — L659 Nonscarring hair loss, unspecified: Secondary | ICD-10-CM

## 2024-04-05 DIAGNOSIS — L680 Hirsutism: Secondary | ICD-10-CM

## 2024-04-05 DIAGNOSIS — Z139 Encounter for screening, unspecified: Secondary | ICD-10-CM

## 2024-04-06 LAB — C3 AND C4
C3 Complement: 104 mg/dL (ref 83–193)
C4 Complement: 10 mg/dL — ABNORMAL LOW (ref 15–57)

## 2024-04-06 LAB — ANTI-DNA ANTIBODY, DOUBLE-STRANDED: ds DNA Ab: 1 [IU]/mL

## 2024-04-06 LAB — SEDIMENTATION RATE: Sed Rate: 28 mm/h (ref 0–30)

## 2024-04-08 ENCOUNTER — Ambulatory Visit: Payer: Self-pay | Admitting: Family Medicine
